# Patient Record
Sex: Female | Born: 1975 | Race: White | Hispanic: No | Marital: Single | Smoking: Never smoker
Health system: Southern US, Community
[De-identification: ages and names within clinical notes are randomized; demographics above are authoritative.]

## PROBLEM LIST (undated history)

## (undated) DIAGNOSIS — I82890 Acute embolism and thrombosis of other specified veins: Secondary | ICD-10-CM

---

## 2013-05-27 ENCOUNTER — Other Ambulatory Visit: Payer: Self-pay | Admitting: Neurology

## 2013-05-27 DIAGNOSIS — R2681 Unsteadiness on feet: Secondary | ICD-10-CM

## 2017-12-07 ENCOUNTER — Emergency Department (HOSPITAL_COMMUNITY)
Admission: EM | Admit: 2017-12-07 | Discharge: 2017-12-08 | Disposition: A | Discharge status: Home / Self Care | Payer: Self-pay | Attending: Emergency Medicine | Admitting: Emergency Medicine

## 2017-12-07 ENCOUNTER — Encounter (HOSPITAL_COMMUNITY): Payer: Self-pay

## 2017-12-07 DIAGNOSIS — K0889 Other specified disorders of teeth and supporting structures: Secondary | ICD-10-CM | POA: Insufficient documentation

## 2017-12-07 DIAGNOSIS — R42 Dizziness and giddiness: Secondary | ICD-10-CM | POA: Insufficient documentation

## 2017-12-07 LAB — CBC
HEMATOCRIT: 39.9 % (ref 36.0–46.0)
HEMOGLOBIN: 13.8 g/dL (ref 12.0–15.0)
MCH: 30.8 pg (ref 26.0–34.0)
MCHC: 34.6 g/dL (ref 30.0–36.0)
MCV: 89.1 fL (ref 78.0–100.0)
Platelets: 162 10*3/uL (ref 150–400)
RBC: 4.48 MIL/uL (ref 3.87–5.11)
RDW: 13.7 % (ref 11.5–15.5)
WBC: 9.3 10*3/uL (ref 4.0–10.5)

## 2017-12-07 LAB — BASIC METABOLIC PANEL
ANION GAP: 11 (ref 5–15)
BUN: 11 mg/dL (ref 6–20)
CALCIUM: 9.3 mg/dL (ref 8.9–10.3)
CO2: 20 mmol/L — ABNORMAL LOW (ref 22–32)
Chloride: 100 mmol/L — ABNORMAL LOW (ref 101–111)
Creatinine, Ser: 0.65 mg/dL (ref 0.44–1.00)
GFR calc Af Amer: >60 mL/min (ref >60)
Glucose, Bld: 104 mg/dL — ABNORMAL HIGH (ref 65–99)
POTASSIUM: 3.5 mmol/L (ref 3.5–5.1)
SODIUM: 131 mmol/L — AB (ref 135–145)

## 2017-12-07 LAB — URINALYSIS, ROUTINE W REFLEX MICROSCOPIC
BILIRUBIN URINE: NEGATIVE
GLUCOSE, UA: NEGATIVE mg/dL
KETONES UR: NEGATIVE mg/dL
LEUKOCYTES UA: NEGATIVE
NITRITE: NEGATIVE
PH: 6 (ref 5.0–8.0)
Protein, ur: NEGATIVE mg/dL
RBC / HPF: NONE SEEN RBC/hpf (ref 0–5)
Specific Gravity, Urine: 1.001 — ABNORMAL LOW (ref 1.005–1.030)

## 2017-12-07 LAB — I-STAT BETA HCG BLOOD, ED (MC, WL, AP ONLY): I-stat hCG, quantitative: <5 m[IU]/mL (ref <5)

## 2017-12-07 NOTE — ED Triage Notes (Signed)
Pt states that she since Tuesday she has been having dental pain on the R upper side, reports not eating in three days and now feels dizzy like she is going to pass out.

## 2017-12-08 LAB — I-STAT VENOUS BLOOD GAS, ED
Acid-base deficit: 3 mmol/L — ABNORMAL HIGH (ref 0.0–2.0)
Bicarbonate: 18.9 mmol/L — ABNORMAL LOW (ref 20.0–28.0)
O2 SAT: 83 %
PH VEN: 7.493 — AB (ref 7.250–7.430)
TCO2: 20 mmol/L — AB (ref 22–32)
pCO2, Ven: 24.7 mmHg — ABNORMAL LOW (ref 44.0–60.0)
pO2, Ven: 42 mmHg (ref 32.0–45.0)

## 2017-12-08 LAB — CBG MONITORING, ED: Glucose-Capillary: 99 mg/dL (ref 65–99)

## 2017-12-08 MED ORDER — MECLIZINE HCL 25 MG PO TABS: 25.0000 mg | ORAL_TABLET | Freq: Three times a day (TID) | ORAL | 0 refills | Status: DC | PRN

## 2017-12-08 MED ORDER — ONDANSETRON 4 MG PO TBDP
4.0000 mg | ORAL_TABLET | Freq: Once | ORAL | Status: AC
  Administered 2017-12-08: 4 mg via ORAL
  Filled 2017-12-08: qty 1

## 2017-12-08 MED ORDER — MIDAZOLAM HCL 2 MG/2ML IJ SOLN
1.0000 mg | Freq: Once | INTRAMUSCULAR | Status: AC
  Administered 2017-12-08: 1 mg via INTRAVENOUS
  Filled 2017-12-08: qty 2

## 2017-12-08 MED ORDER — ONDANSETRON 4 MG PO TBDP: 4.0000 mg | ORAL_TABLET | Freq: Three times a day (TID) | ORAL | 0 refills | Status: AC | PRN

## 2017-12-08 MED ORDER — BUPIVACAINE HCL (PF) 0.5 % IJ SOLN
10.0000 mL | Freq: Once | INTRAMUSCULAR | Status: AC
  Administered 2017-12-08: 10 mL
  Filled 2017-12-08: qty 10

## 2017-12-08 MED ORDER — ACETAMINOPHEN 325 MG PO TABS
650.0000 mg | ORAL_TABLET | Freq: Once | ORAL | Status: AC
  Administered 2017-12-08: 650 mg via ORAL
  Filled 2017-12-08: qty 2

## 2017-12-08 MED ORDER — MECLIZINE HCL 25 MG PO TABS
25.0000 mg | ORAL_TABLET | Freq: Once | ORAL | Status: AC
  Administered 2017-12-08: 25 mg via ORAL
  Filled 2017-12-08: qty 1

## 2017-12-08 NOTE — ED Notes (Signed)
Pt reports inability to sleep, eat or "do anything" due to the pain.  Pt tearful in hall bed.

## 2017-12-08 NOTE — ED Notes (Signed)
Pt verbalized understanding of discharge paperwork and prescriptions. Pt A&O, ambulatory and to call cab for discharge. Reports no more dizziness.

## 2017-12-08 NOTE — ED Provider Notes (Signed)
I assumed care of this patient from Dr. Rhunette CroftNanavati at 0730.  Please see their note for further details of Hx, PE.  Briefly patient is a 42 y.o. female who presented with dental pain and lightheadedness. Pain improved with local anesthetic. Screening labs notable for mild hyponatremia. Provided with antivert for lightheadedness  Current plan is to reassess.  On reassessment, pt sx improved. Able to ambulate w/o complication.   The patient is safe for discharge with strict return precautions.  Disposition: Discharge  Condition: Good  I have discussed the results, Dx and Tx plan with the patient who expressed understanding and agree(s) with the plan. Discharge instructions discussed at great length. The patient was given strict return precautions who verbalized understanding of the instructions. No further questions at time of discharge.    ED Discharge Orders        Ordered    meclizine (ANTIVERT) 25 MG tablet  3 times daily PRN     12/08/17 0911    ondansetron (ZOFRAN ODT) 4 MG disintegrating tablet  Every 8 hours PRN     12/08/17 0911        Nira Connardama, Pedro Eduardo, MD 12/08/17 2000

## 2017-12-08 NOTE — ED Notes (Signed)
Pt ambulatory to bathroom

## 2017-12-27 NOTE — ED Provider Notes (Signed)
[facility] EMERGENCY DEPARTMENT Provider Note   CSN: [id number] Arrival date & time: 12/07/17  2213     History   Chief Complaint Chief Complaint  Patient presents with  . Dental Pain  . Dizziness    HPI Elaine Williams is a 42 y.o. female.  HPI 42 year old female comes in with chief complaint of dental pain.  Patient is also having dizziness and lightheadedness.  Patient states that she has had toothache for the last several days which has affected her p.o. intake.  Patient has pain with any kind of food intake, and she has been drinking as much water she can tolerate.  Patient reports that her pain is on the right upper quadrant of her mouth.  She denies any associated nausea, vomiting, fevers, chills.  Patient has felt like she might faint on couple of occasions.  She is taking pain medications at home with minimal relief.  History reviewed. No pertinent past medical history.  There are no active problems to display for this patient.   Past Surgical History:  Procedure Laterality Date  . CESAREAN SECTION       OB History   None      Home Medications    Prior to Admission medications   Medication Sig Start Date End Date Taking? Authorizing Provider  ibuprofen (ADVIL,MOTRIN) 200 MG tablet Take 200 mg by mouth every 6 (six) hours as needed for fever or mild pain.   Yes [provider]  meclizine (ANTIVERT) 25 MG tablet Take 1 tablet (25 mg total) by mouth 3 (three) times daily as needed for dizziness. 12/08/17   Nira Conn, MD    Family History No family history on file.  Social History Social History   Tobacco Use  . Smoking status: Never Smoker  . Smokeless tobacco: Never Used  Substance Use Topics  . Alcohol use: Never    Frequency: Never  . Drug use: Never     Allergies   Patient has no known allergies.   Review of Systems Review of Systems  Constitutional: Positive for activity change.  HENT: Positive for  dental problem.   Respiratory: Negative for shortness of breath.   Cardiovascular: Negative for chest pain.  Neurological: Positive for dizziness. Negative for syncope.  All other systems reviewed and are negative.    Physical Exam Updated Vital Signs BP 104/68 (BP Location: Right Arm)   Pulse 61   Temp 98.4 F (36.9 C) (Oral)   Resp 17   LMP 11/16/2017   SpO2 99%   Physical Exam  Constitutional: She is oriented to person, place, and time. She appears well-developed.  HENT:  Head: Normocephalic and atraumatic.  Cracked tooth in the right upper quadrant  Eyes: Pupils are equal, round, and reactive to light. EOM are normal.  Neck: Normal range of motion. Neck supple.  Cardiovascular: Normal rate.  Pulmonary/Chest: Effort normal and breath sounds normal.  Abdominal: Bowel sounds are normal.  Musculoskeletal: She exhibits no edema or tenderness.  Neurological: She is alert and oriented to person, place, and time.  Skin: Skin is warm and dry.  Nursing note and vitals reviewed.    ED Treatments / Results  Labs (all labs ordered are listed, but only abnormal results are displayed) Labs Reviewed  BASIC METABOLIC PANEL - Abnormal; Notable for the following components:      Result Value   Sodium 131 (*)    Chloride 100 (*)    CO2 20 (*)  Glucose, Bld 104 (*)    All other components within normal limits  URINALYSIS, ROUTINE W REFLEX MICROSCOPIC - Abnormal; Notable for the following components:   Color, Urine COLORLESS (*)    Specific Gravity, Urine 1.001 (*)    Hgb urine dipstick SMALL (*)    Bacteria, UA RARE (*)    Squamous Epithelial / LPF 0-5 (*)    All other components within normal limits  I-STAT VENOUS BLOOD GAS, ED - Abnormal; Notable for the following components:   pH, Ven 7.493 (*)    pCO2, Ven 24.7 (*)    Bicarbonate 18.9 (*)    TCO2 20 (*)    Acid-base deficit 3.0 (*)    All other components within normal limits  CBC  I-STAT BETA HCG BLOOD, ED ([facility], [facility],  [facility] ONLY)  CBG MONITORING, ED    EKG EKG Interpretation  Date/Time:  Sunday December 08 2017 06:11:28 EDT Ventricular Rate:  61 PR Interval:    QRS Duration: 92 QT Interval:  425 QTC Calculation: 429 R Axis:   84 Text Interpretation:  Sinus rhythm No acute changes Nonspecific ST and T wave abnormality No old tracing to compare Confirmed by Derwood Kaplan [phone]) on 12/08/2017 6:44:01 AM   Radiology No results found.  Procedures .Nerve Block Date/Time: 12/08/2017 4:23 PM Performed by: Derwood Kaplan, MD Authorized by: Derwood Kaplan, MD   Consent:    Consent obtained:  Verbal   Consent given by:  Patient   Risks discussed:  Infection, allergic reaction, nerve damage, swelling, unsuccessful block, pain, intravenous injection and bleeding   Alternatives discussed:  No treatment Indications:    Indications:  Pain relief Location:    Laterality:  Right Pre-procedure details:    Skin preparation:  Alcohol   Preparation: Patient was prepped and draped in usual sterile fashion   Skin anesthesia (see MAR for exact dosages):    Skin anesthesia method:  None Procedure details (see MAR for exact dosages):    Block needle gauge:  22 G   Anesthetic injected:  Bupivacaine 0.5% WITH epi   Steroid injected:  None   Additive injected:  None   Injection procedure:  Anatomic landmarks identified Post-procedure details:    Dressing:  None   Outcome:  Anesthesia achieved   Patient tolerance of procedure:  Tolerated well, no immediate complications   (including critical care time)  Medications Ordered in ED Medications  bupivacaine (MARCAINE) 0.5 % injection 10 mL (10 mLs Infiltration Given by Other 12/08/17 0509)  acetaminophen (TYLENOL) tablet 650 mg (650 mg Oral Given 12/08/17 0509)  ondansetron (ZOFRAN-ODT) disintegrating tablet 4 mg (4 mg Oral Given 12/08/17 0643)  midazolam (VERSED) injection 1 mg (1 mg Intravenous Given 12/08/17 0755)  meclizine (ANTIVERT) tablet 25 mg (25 mg Oral  Given 12/08/17 0830)     Initial Impression / Assessment and Plan / ED Course  I have reviewed the triage vital signs and the nursing notes.  Pertinent labs & imaging results that were available during my care of the patient were reviewed by me and considered in my medical decision making (see chart for details).     Patient comes in with chief complaint of dental pain and dizziness.  Her dental pain has led to reduced p.o. intake and patient now has had episodes of dizziness with near syncope.  Patient does not have any cardiac disease history, and her labs overall are reassuring.  I suspect that her dizziness, near syncope is due to combination of mild dehydration  and severe pain that is anxiety provoking to her.  Patient is agreed to dental nerve block which was completed in the ED.  Patient did get some relief from that.  We do want to make sure that patient is tolerating p.o. before discharging her, therefore we are transferring care to Dr. Eudelia Bunch who will follow. Labs have been checked.  There is mild acidosis and mild hyponatremia.   Final Clinical Impressions(s) / ED Diagnoses   Final diagnoses:  Pain, dental  Dizziness    ED Discharge Orders        Ordered    meclizine (ANTIVERT) 25 MG tablet  3 times daily PRN     12/08/17 0911    ondansetron (ZOFRAN ODT) 4 MG disintegrating tablet  Every 8 hours PRN     12/08/17 0911       Derwood Kaplan, MD 12/27/17 1626

## 2020-11-26 ENCOUNTER — Emergency Department (HOSPITAL_COMMUNITY): Payer: Medicaid Other

## 2020-11-26 ENCOUNTER — Other Ambulatory Visit: Payer: Self-pay

## 2020-11-26 ENCOUNTER — Emergency Department (HOSPITAL_COMMUNITY)
Admission: EM | Admit: 2020-11-26 | Discharge: 2020-11-26 | Disposition: A | Discharge status: Home / Self Care | Payer: Medicaid Other | Attending: Emergency Medicine | Admitting: Emergency Medicine

## 2020-11-26 DIAGNOSIS — R1030 Lower abdominal pain, unspecified: Secondary | ICD-10-CM | POA: Diagnosis present

## 2020-11-26 DIAGNOSIS — K5732 Diverticulitis of large intestine without perforation or abscess without bleeding: Secondary | ICD-10-CM | POA: Insufficient documentation

## 2020-11-26 DIAGNOSIS — K5792 Diverticulitis of intestine, part unspecified, without perforation or abscess without bleeding: Secondary | ICD-10-CM

## 2020-11-26 LAB — COMPREHENSIVE METABOLIC PANEL
ALT: 12 U/L (ref 0–44)
AST: 13 U/L — ABNORMAL LOW (ref 15–41)
Albumin: 3.8 g/dL (ref 3.5–5.0)
Alkaline Phosphatase: 76 U/L (ref 38–126)
Anion gap: 8 (ref 5–15)
BUN: 12 mg/dL (ref 6–20)
CO2: 23 mmol/L (ref 22–32)
Calcium: 9.4 mg/dL (ref 8.9–10.3)
Chloride: 105 mmol/L (ref 98–111)
Creatinine, Ser: 0.7 mg/dL (ref 0.44–1.00)
GFR, Estimated: >60 mL/min (ref >60)
Glucose, Bld: 111 mg/dL — ABNORMAL HIGH (ref 70–99)
Potassium: 3.4 mmol/L — ABNORMAL LOW (ref 3.5–5.1)
Sodium: 136 mmol/L (ref 135–145)
Total Bilirubin: 0.6 mg/dL (ref 0.3–1.2)
Total Protein: 7.3 g/dL (ref 6.5–8.1)

## 2020-11-26 LAB — URINALYSIS, ROUTINE W REFLEX MICROSCOPIC
Glucose, UA: NEGATIVE mg/dL
Ketones, ur: 15 mg/dL — AB
Leukocytes,Ua: NEGATIVE
Nitrite: NEGATIVE
Protein, ur: 30 mg/dL — AB
Specific Gravity, Urine: 1.025 (ref 1.005–1.030)
pH: 6 (ref 5.0–8.0)

## 2020-11-26 LAB — CBC
HCT: 42.6 % (ref 36.0–46.0)
Hemoglobin: 14.5 g/dL (ref 12.0–15.0)
MCH: 30.9 pg (ref 26.0–34.0)
MCHC: 34 g/dL (ref 30.0–36.0)
MCV: 90.8 fL (ref 80.0–100.0)
Platelets: 196 10*3/uL (ref 150–400)
RBC: 4.69 MIL/uL (ref 3.87–5.11)
RDW: 12.9 % (ref 11.5–15.5)
WBC: 12 10*3/uL — ABNORMAL HIGH (ref 4.0–10.5)
nRBC: 0 % (ref 0.0–0.2)

## 2020-11-26 LAB — I-STAT BETA HCG BLOOD, ED (MC, WL, AP ONLY): I-stat hCG, quantitative: <5 m[IU]/mL (ref <5)

## 2020-11-26 LAB — URINALYSIS, MICROSCOPIC (REFLEX): WBC, UA: NONE SEEN WBC/hpf (ref 0–5)

## 2020-11-26 LAB — LIPASE, BLOOD: Lipase: 24 U/L (ref 11–51)

## 2020-11-26 MED ORDER — ONDANSETRON 8 MG PO TBDP: 8.0000 mg | ORAL_TABLET | Freq: Three times a day (TID) | ORAL | 0 refills | Status: DC | PRN

## 2020-11-26 MED ORDER — ONDANSETRON HCL 4 MG/2ML IJ SOLN
4.0000 mg | Freq: Once | INTRAMUSCULAR | Status: AC
  Administered 2020-11-26: 4 mg via INTRAVENOUS
  Filled 2020-11-26: qty 2

## 2020-11-26 MED ORDER — CIPROFLOXACIN HCL 500 MG PO TABS: 500.0000 mg | ORAL_TABLET | Freq: Two times a day (BID) | ORAL | 0 refills | Status: DC

## 2020-11-26 MED ORDER — SODIUM CHLORIDE 0.9 % IV BOLUS
1000.0000 mL | Freq: Once | INTRAVENOUS | Status: AC
  Administered 2020-11-26: 1000 mL via INTRAVENOUS

## 2020-11-26 MED ORDER — METRONIDAZOLE 500 MG PO TABS: 500.0000 mg | ORAL_TABLET | Freq: Two times a day (BID) | ORAL | 0 refills | Status: DC

## 2020-11-26 MED ORDER — CIPROFLOXACIN IN D5W 400 MG/200ML IV SOLN
400.0000 mg | Freq: Once | INTRAVENOUS | Status: DC
  Filled 2020-11-26: qty 200

## 2020-11-26 MED ORDER — METRONIDAZOLE 500 MG PO TABS
500.0000 mg | ORAL_TABLET | Freq: Once | ORAL | Status: AC
  Administered 2020-11-26: 500 mg via ORAL
  Filled 2020-11-26: qty 1

## 2020-11-26 MED ORDER — FENTANYL CITRATE (PF) 100 MCG/2ML IJ SOLN
25.0000 ug | Freq: Once | INTRAMUSCULAR | Status: AC
  Administered 2020-11-26: 25 ug via INTRAVENOUS
  Filled 2020-11-26: qty 2

## 2020-11-26 MED ORDER — IOHEXOL 300 MG/ML  SOLN
100.0000 mL | Freq: Once | INTRAMUSCULAR | Status: AC | PRN
  Administered 2020-11-26: 100 mL via INTRAVENOUS

## 2020-11-26 NOTE — ED Notes (Signed)
Pt's IV was removed by nurse tech once the doctor had put this patient up for discharge. Dr. Rosalia Hammers informed and states ok for patient to leave and get her prescriptions filled to take her ABX as prescribed. She states for the patient to take her prescriptions as prescribed. I went over all of this with the patient and she verbalized understanding of d/c instructions, prescribed medications and follow up care.

## 2020-11-26 NOTE — ED Provider Notes (Signed)
[facility] EMERGENCY DEPARTMENT Provider Note   CSN: [id number] Arrival date & time: 11/26/20  7106     History No chief complaint on file.   Elaine Williams is a 45 y.o. female.  HPI 45 year old female presents today complaining of crampy lower abdominal pain for the past week with associated nausea, vomiting, and diarrhea.  She has had multiple episodes of vomiting and stooling without any blood in her emesis.  She has had some chills but no fever.  Last night she had several episodes of urinating that revealed blood.  She states she had pain difficulty starting her stream with the episodes.  She reports a normal amount of urine.  She denies any past history of urinary tract infection.  She denies sexual activity over the past year.  He has had crampy lower abdominal pain with any food intake over the past week.  She has not had anything down to eat over the past 36 hours.  She has been sipping on water.  She has not recently vomited.  She has previously had a C-section but no other abdominal surgery.  She denies any significant past medical history and is not taking any medications.  Last menstrual cycle was last month and was normal and a normal time.  She is not currently using any birth control.     No past medical history on file.  There are no problems to display for this patient.   Past Surgical History:  Procedure Laterality Date  . CESAREAN SECTION       OB History   No obstetric history on file.     No family history on file.  Social History   Tobacco Use  . Smoking status: Never Smoker  . Smokeless tobacco: Never Used  Substance Use Topics  . Alcohol use: Never  . Drug use: Never    Home Medications Prior to Admission medications   Medication Sig Start Date End Date Taking? Authorizing Provider  ibuprofen (ADVIL,MOTRIN) 200 MG tablet Take 200 mg by mouth every 6 (six) hours as needed for fever or mild pain.    [provider]   meclizine (ANTIVERT) 25 MG tablet Take 1 tablet (25 mg total) by mouth 3 (three) times daily as needed for dizziness. 12/08/17   Nira Conn, MD    Allergies    Patient has no known allergies.  Review of Systems   Review of Systems  All other systems reviewed and are negative.   Physical Exam Updated Vital Signs BP 127/84   Pulse (!) 102   Temp 98.3 F (36.8 C) (Oral)   Resp 16   Ht 1.702 m (5\' 7" )   Wt 81.6 kg   LMP 11/09/2020   SpO2 99%   BMI 28.19 kg/m   Physical Exam Vitals and nursing note reviewed.  Constitutional:      Appearance: Normal appearance.  HENT:     Head: Normocephalic.     Left Ear: External ear normal.     Nose: Nose normal.     Mouth/Throat:     Pharynx: Oropharynx is clear.  Eyes:     Extraocular Movements: Extraocular movements intact.     Pupils: Pupils are equal, round, and reactive to light.  Cardiovascular:     Rate and Rhythm: Normal rate and regular rhythm.     Pulses: Normal pulses.  Pulmonary:     Effort: Pulmonary effort is normal.     Breath sounds: Normal breath sounds.  Abdominal:  General: Abdomen is flat. There is no distension.     Tenderness: There is abdominal tenderness.     Comments: Tenderness bilateral lower quadrants greater in the right lower quadrant  Musculoskeletal:        General: Normal range of motion.     Cervical back: Normal range of motion and neck supple.  Skin:    General: Skin is warm and dry.     Capillary Refill: Capillary refill takes less than 2 seconds.  Neurological:     General: No focal deficit present.     Mental Status: She is alert.  Psychiatric:        Mood and Affect: Mood normal.        Behavior: Behavior normal.     ED Results / Procedures / Treatments   Labs (all labs ordered are listed, but only abnormal results are displayed) Labs Reviewed  CBC - Abnormal; Notable for the following components:      Result Value   WBC 12.0 (*)    All other components within  normal limits  COMPREHENSIVE METABOLIC PANEL - Abnormal; Notable for the following components:   Potassium 3.4 (*)    Glucose, Bld 111 (*)    AST 13 (*)    All other components within normal limits  URINALYSIS, ROUTINE W REFLEX MICROSCOPIC - Abnormal; Notable for the following components:   Hgb urine dipstick MODERATE (*)    Bilirubin Urine SMALL (*)    Ketones, ur 15 (*)    Protein, ur 30 (*)    All other components within normal limits  URINALYSIS, MICROSCOPIC (REFLEX) - Abnormal; Notable for the following components:   Bacteria, UA RARE (*)    All other components within normal limits  LIPASE, BLOOD  I-STAT BETA HCG BLOOD, ED ([facility], [facility], [facility] ONLY)    EKG None  Radiology CT ABDOMEN PELVIS W CONTRAST  Result Date: 11/26/2020 CLINICAL DATA:  45 year old female with history of acute nonlocalized abdominal pain for the past 2 weeks. Vomiting for the past 2 days. EXAM: CT ABDOMEN AND PELVIS WITH CONTRAST TECHNIQUE: Multidetector CT imaging of the abdomen and pelvis was performed using the standard protocol following bolus administration of intravenous contrast. CONTRAST:  OMNIPAQUE IOHEXOL 300 MG/ML  SOLN COMPARISON:  No priors. FINDINGS: Lower chest: Unremarkable. Hepatobiliary: No suspicious cystic or solid hepatic lesions. No intra or extrahepatic biliary ductal dilatation. Gallbladder is normal in appearance. Pancreas: No pancreatic mass. No pancreatic ductal dilatation. No pancreatic or peripancreatic fluid collections or inflammatory changes. Spleen: Unremarkable. Adrenals/Urinary Tract: Bilateral kidneys and bilateral adrenal glands are normal in appearance. No hydroureteronephrosis. Gas is noted non dependently in the lumen of the urinary bladder. Urinary bladder is otherwise normal in appearance. Stomach/Bowel: Normal appearance of the stomach. No pathologic dilatation of small bowel or colon. Numerous colonic diverticulae are noted, particularly in the sigmoid colon where there are  some subtle inflammatory changes noted adjacent to the proximal sigmoid colon, which could indicate very early or mild acute diverticulitis. No diverticular abscess or signs of frank perforation are noted at this time. Normal appendix. Vascular/Lymphatic: No significant atherosclerotic noted in the abdominal or pelvic vasculature disease, aneurysm. No lymphadenopathy noted in the abdomen or pelvis. Reproductive: Uterus is normal in appearance. Left ovary is unremarkable in appearance. Degenerating corpus luteum cyst in the right ovary incidentally noted. Other: Trace volume of free fluid in the cul-de-sac, presumably physiologic in this young female patient. No larger volume of ascites. No pneumoperitoneum. Musculoskeletal: There are no  aggressive appearing lytic or blastic lesions noted in the visualized portions of the skeleton. IMPRESSION: 1. Gas is present non dependently in the lumen of the urinary bladder. This could be iatrogenic if there has been recent catheterization for urinalysis. In the absence of a history of catheterization, correlation with urinalysis would be recommended to exclude the possibility of urinary tract infection with gas-forming organisms. Otherwise, there are no findings to account for the patient's hematuria. 2. Subtle inflammatory changes adjacent to the sigmoid colon where there are numerous colonic diverticulae are. Findings are concerning for early or mild acute diverticulitis. No diverticular abscess or signs of frank perforation are noted at this time. 3. Degenerating corpus luteum cyst in the right ovary with small volume of physiologic free fluid in the pelvis. Electronically Signed   By: Trudie Reed M.D.   On: 11/26/2020 11:16    Procedures Procedures   Medications Ordered in ED Medications  sodium chloride 0.9 % bolus 1,000 mL (has no administration in time range)  ondansetron (ZOFRAN) injection 4 mg (has no administration in time range)  fentaNYL (SUBLIMAZE)  injection 25 mcg (has no administration in time range)    ED Course  I have reviewed the triage vital signs and the nursing notes.  Pertinent labs & imaging results that were available during my care of the patient were reviewed by me and considered in my medical decision making (see chart for details). Differential diagnosis Acute abdominal process such as pancreatitis, cholecystitis, appendicitis, diverticulitis, or other intra-abdominal processes Pelvic processes such as pregnancy, pelvic infection, or other acute pelvic processes Infectious etiology including colitis, gastroenteritis Patient with urinary tract bleeding per report could be secondary to stone disease, trauma, or infection CT obtained appears most consistent with diverticulitis.  There is some air noted in the urinary tract however, urine does not appear acutely infected and patient did have In-N-Out cath here was likely explaining the air.  No other etiology of her hematuria is noted.  Given concern for diverticulitis, patient started on Cipro and Flagyl here.  She will continue this outpatient.  We discussed that this would also cover your urinary pathogens. We have discussed return precautions and need for follow-up and she voices understanding. MDM Rules/Calculators/A&P                           Final Clinical Impression(s) / ED Diagnoses Final diagnoses:  Diverticulitis    Rx / DC Orders ED Discharge Orders         Ordered    ciprofloxacin (CIPRO) 500 MG tablet  Every 12 hours        11/26/20 1200    metroNIDAZOLE (FLAGYL) 500 MG tablet  2 times daily        11/26/20 1200           Margarita Grizzle, MD 11/26/20 1201

## 2020-11-26 NOTE — ED Triage Notes (Signed)
Patient arrives with c/o abd pain x2 weeks, vomiting x2 days, and heavy blood in urine with painful urination (no blood noted today). Patient reports nausea, headache, and unable to keep food/liquids down. Denies pregnancy.

## 2020-11-26 NOTE — Discharge Instructions (Addendum)
Please take medications as prescribed Please drink plenty of clear liquids Return to the emergency department if you have any worsening symptoms including increased bleeding or worsening pain. Please obtain primary care doctor in follow-up as an outpatient in the next 1 to 2 weeks

## 2021-11-09 IMAGING — CT CT ABD-PELV W/ CM
2 of 5 series · 15 of 46 positions shown, 17 images · IV contrast (omnipaque)
Comparison: No priors.

CLINICAL DATA: 44-year-old female with history of acute
nonlocalized abdominal pain for the past 2 weeks. Vomiting for the
past 2 days.

EXAM:
CT ABDOMEN AND PELVIS WITH CONTRAST
TECHNIQUE: Multidetector CT imaging of the abdomen and pelvis was performed
using the standard protocol following bolus administration of
intravenous contrast.
CONTRAST:  100mL OMNIPAQUE IOHEXOL 300 MG/ML  SOLN

[Series 4: abdomen 5.0 · axial · 0.78mm/px · z∈[+610,+1000]mm · 12 of 92 slices shown, 14 images]
[im 7/92  soft-tissue]
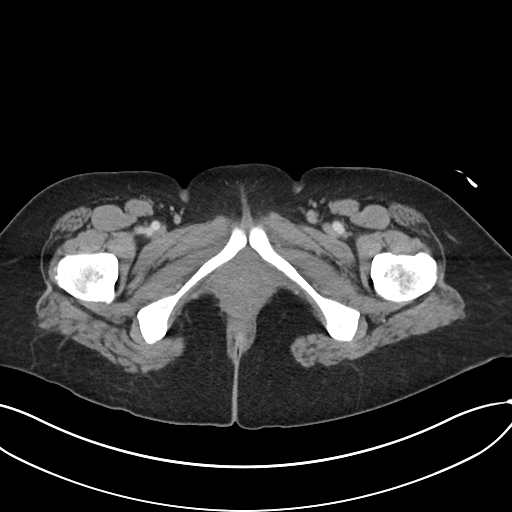
[im 7/92  bone]
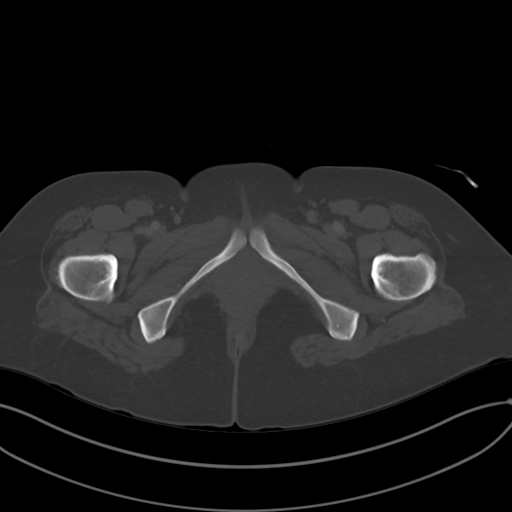
[im 13/92  soft-tissue]
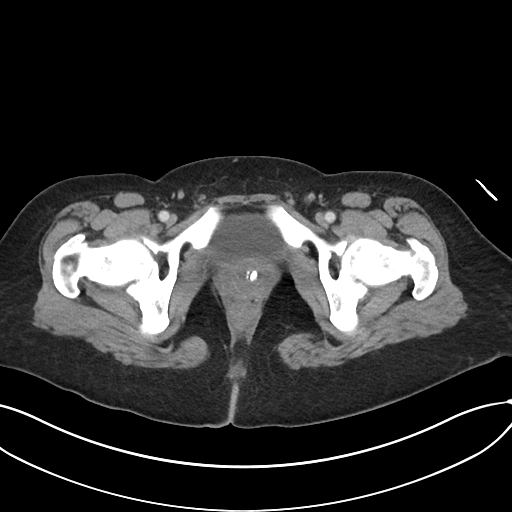
[im 19/92  soft-tissue]
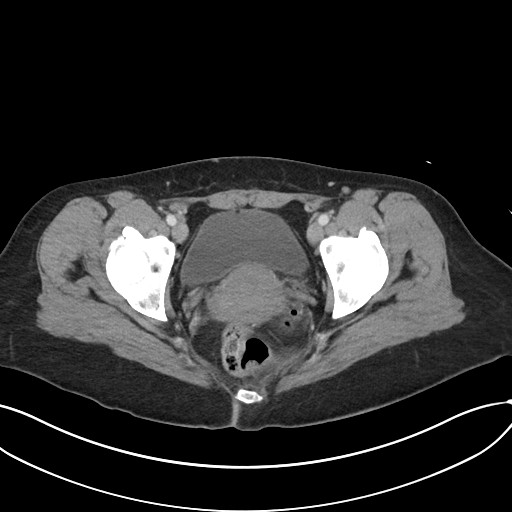
[im 31/92  soft-tissue]
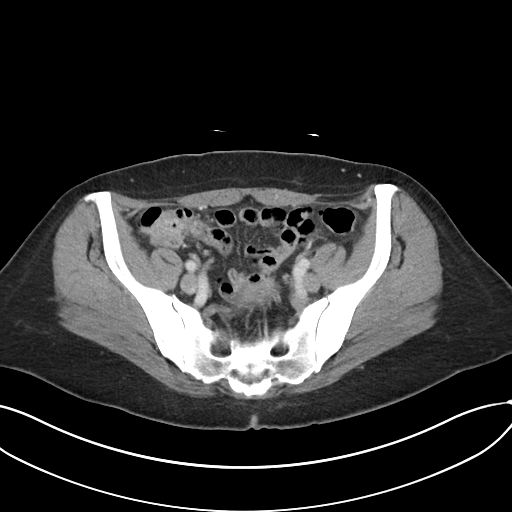
[im 37/92  soft-tissue]
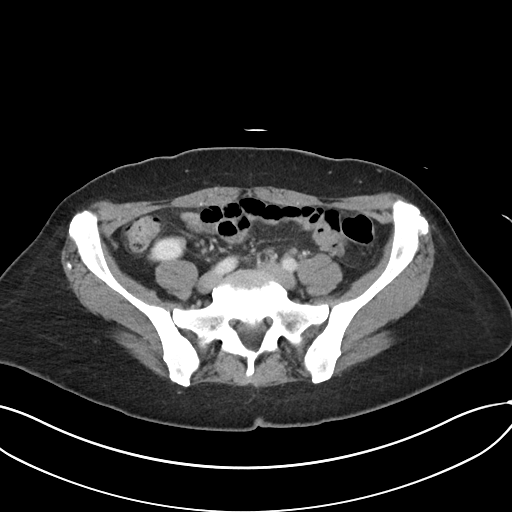
[im 43/92  soft-tissue]
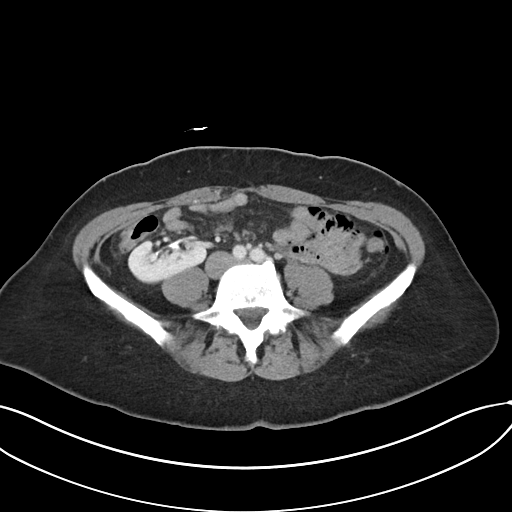
[im 49/92  soft-tissue]
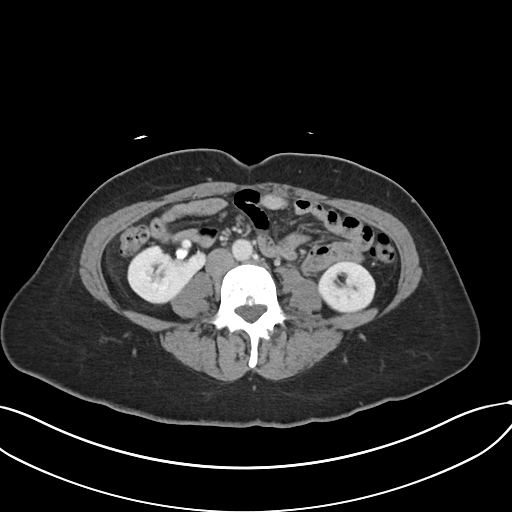
[im 55/92  soft-tissue]
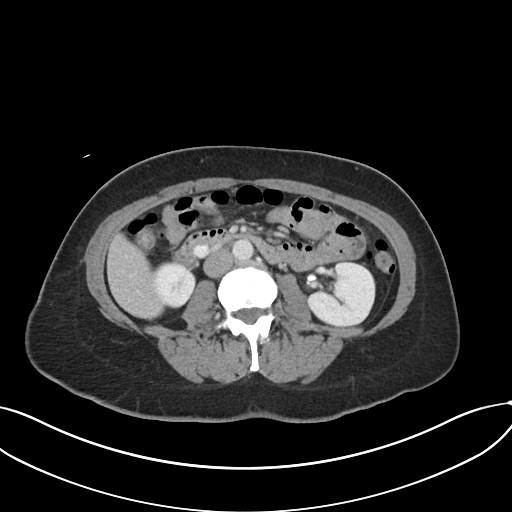
[im 61/92  soft-tissue]
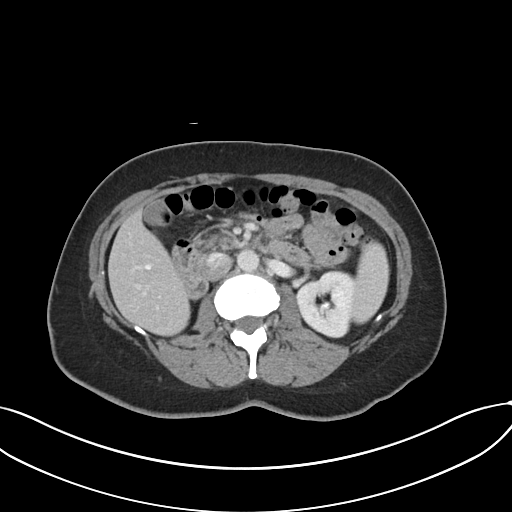
[im 61/92  bone]
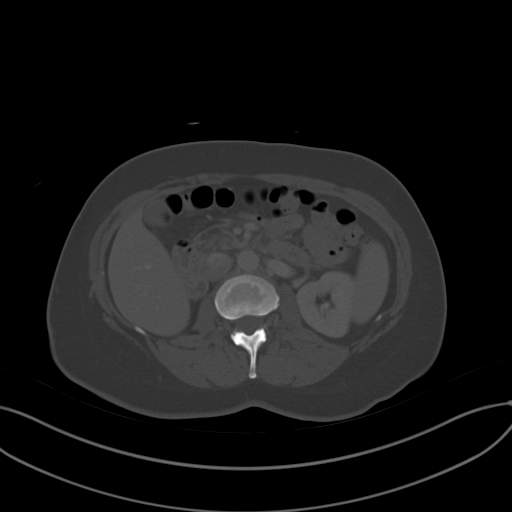
[im 73/92  soft-tissue]
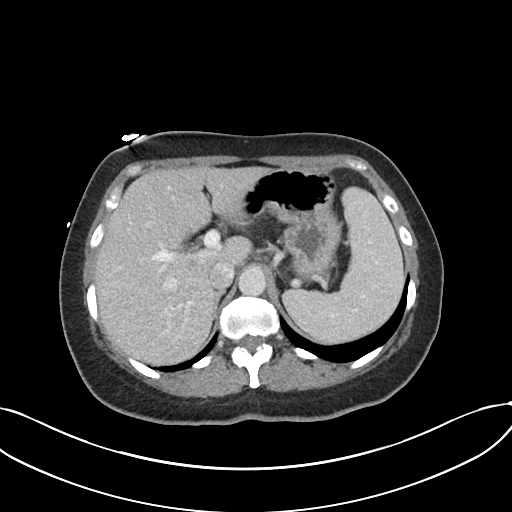
[im 79/92  soft-tissue]
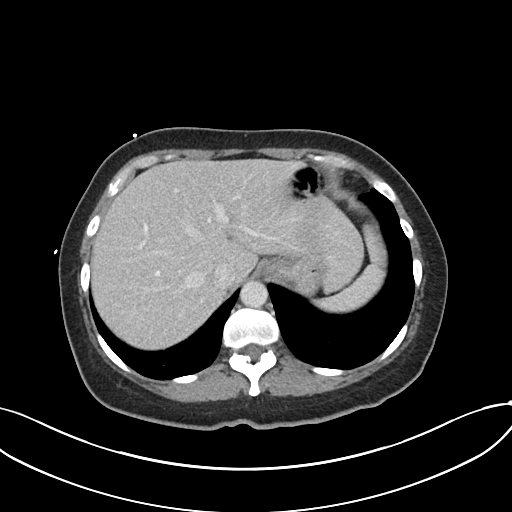
[im 85/92  soft-tissue]
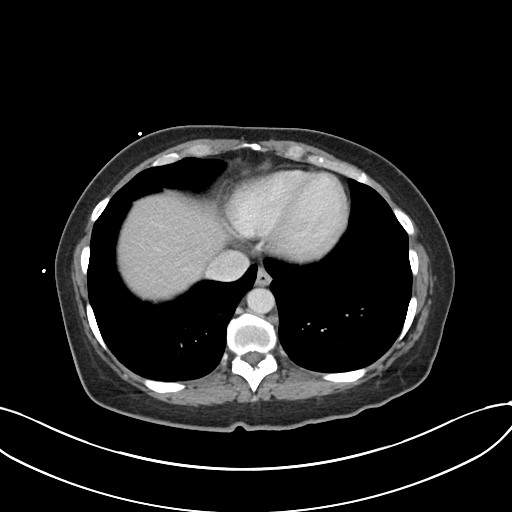

[Series 7: abdomen 3.0 mpr cor · coronal · 0.78mm/px · 3 of 82 slices shown]
[im 28/82  soft-tissue]
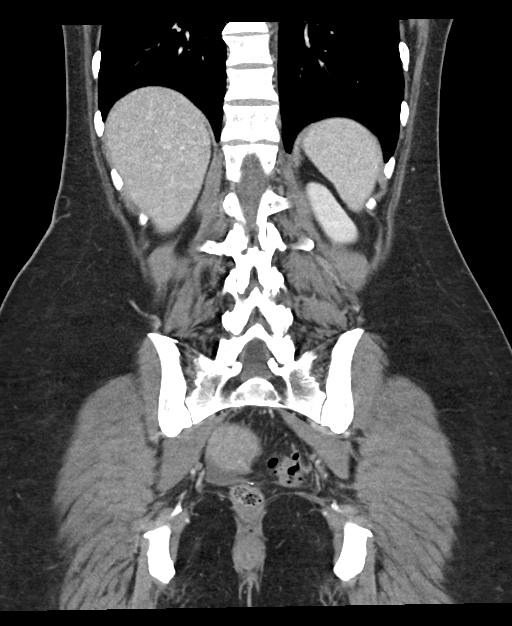
[im 37/82  soft-tissue]
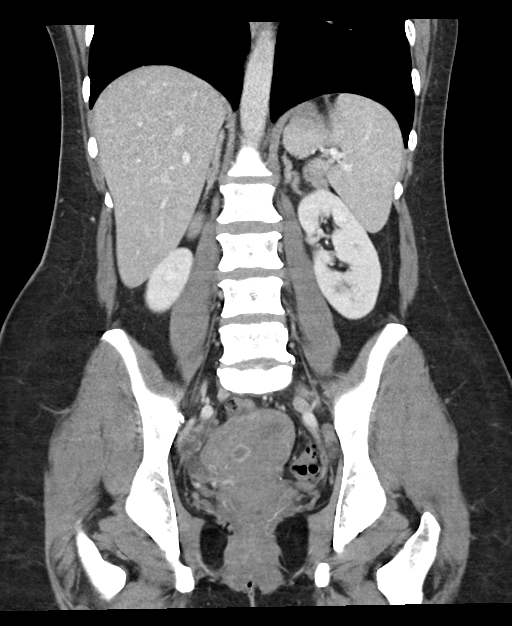
[im 46/82  soft-tissue]
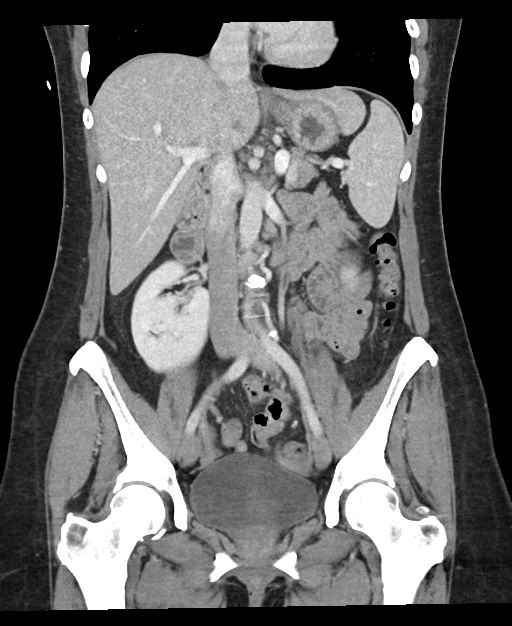

[15 of 46 positions shown; findings below may reference images not displayed]

FINDINGS: Lower chest: Unremarkable.

Hepatobiliary: No suspicious cystic or solid hepatic lesions. No
intra or extrahepatic biliary ductal dilatation. Gallbladder is
normal in appearance.

Pancreas: No pancreatic mass. No pancreatic ductal dilatation. No
pancreatic or peripancreatic fluid collections or inflammatory
changes.

Spleen: Unremarkable.

Adrenals/Urinary Tract: Bilateral kidneys and bilateral adrenal
glands are normal in appearance. No hydroureteronephrosis. Gas is
noted non dependently in the lumen of the urinary bladder. Urinary
bladder is otherwise normal in appearance.

Stomach/Bowel: Normal appearance of the stomach. No pathologic
dilatation of small bowel or colon. Numerous colonic diverticulae
are noted, particularly in the sigmoid colon where there are some
subtle inflammatory changes noted adjacent to the proximal sigmoid
colon, which could indicate very early or mild acute diverticulitis.
No diverticular abscess or signs of frank perforation are noted at
this time. Normal appendix.

Vascular/Lymphatic: No significant atherosclerotic noted in the
abdominal or pelvic vasculature disease, aneurysm. No
lymphadenopathy noted in the abdomen or pelvis.

Reproductive: Uterus is normal in appearance. Left ovary is
unremarkable in appearance. Degenerating corpus luteum cyst in the
right ovary incidentally noted.

Other: Trace volume of free fluid in the cul-de-sac, presumably
physiologic in this young female patient. No larger volume of
ascites. No pneumoperitoneum.

Musculoskeletal: There are no aggressive appearing lytic or blastic
lesions noted in the visualized portions of the skeleton.
IMPRESSION: 1. Gas is present non dependently in the lumen of the urinary
bladder. This could be iatrogenic if there has been recent
catheterization for urinalysis. In the absence of a history of
catheterization, correlation with urinalysis would be recommended to
exclude the possibility of urinary tract infection with gas-forming
organisms. Otherwise, there are no findings to account for the
patient's hematuria.
2. Subtle inflammatory changes adjacent to the sigmoid colon where
there are numerous colonic diverticulae are. Findings are concerning
for early or mild acute diverticulitis. No diverticular abscess or
signs of frank perforation are noted at this time.
3. Degenerating corpus luteum cyst in the right ovary with small
volume of physiologic free fluid in the pelvis.

## 2023-01-21 ENCOUNTER — Ambulatory Visit
Admission: EM | Admit: 2023-01-21 | Discharge: 2023-01-21 | Disposition: A | Discharge status: Home / Self Care | Payer: Managed Care, Other (non HMO) | Attending: Urgent Care | Admitting: Urgent Care

## 2023-01-21 DIAGNOSIS — J0100 Acute maxillary sinusitis, unspecified: Secondary | ICD-10-CM

## 2023-01-21 MED ORDER — PSEUDOEPHEDRINE HCL 60 MG PO TABS: 60.0000 mg | ORAL_TABLET | Freq: Three times a day (TID) | ORAL | 0 refills | Status: DC | PRN

## 2023-01-21 MED ORDER — CETIRIZINE HCL 10 MG PO TABS: 10.0000 mg | ORAL_TABLET | Freq: Every day | ORAL | 0 refills | Status: DC

## 2023-01-21 MED ORDER — AMOXICILLIN 875 MG PO TABS: 875.0000 mg | ORAL_TABLET | Freq: Two times a day (BID) | ORAL | 0 refills | Status: DC

## 2023-01-21 NOTE — ED Triage Notes (Signed)
Pt reports nasal congestion, nausea, headache x 2 weeks. States "smells rotten" when blows the nose. OTC cold meds gives no relief.

## 2023-01-21 NOTE — Discharge Instructions (Addendum)
We will manage this as a sinus infection with amoxicillin. For sore throat or cough try using a honey-based tea. Use 3 teaspoons of honey with juice squeezed from half lemon. Place shaved pieces of ginger into 1/2-1 cup of water and warm over stove top. Then mix the ingredients and repeat every 4 hours as needed. Please take ibuprofen 600mg every 6 hours with food alternating with OR taken together with Tylenol 650mg every 6 hours for throat pain, fevers, aches and pains. Hydrate very well with at least 2 liters of water. Eat light meals such as soups (chicken and noodles, vegetable, chicken and wild rice).  Do not eat foods that you are allergic to.  Taking an antihistamine like Zyrtec can help against postnasal drainage, sinus congestion which can cause sinus pain, sinus headaches, throat pain, painful swallowing, coughing.  You can take this together with pseudoephedrine (Sudafed) at a dose of 60 mg 3 times a day or twice daily as needed for the same kind of nasal drip, congestion.    

## 2023-01-21 NOTE — ED Provider Notes (Signed)
[facility]  Note:  This document was prepared using Conservation officer, historic buildings and may include unintentional dictation errors.  MRN: [id number] DOB: [birth year]  Subjective:   Elaine Williams is a 47 y.o. female presenting for 2-week history of persistent and worsening sinus congestion, sinus headaches, nausea.  Started coughing in the past few days and the mucus that is coming out of her sinuses is much darker colored with some malodor.  No chest pain, shortness of breath or wheezing.  No ear pain, throat pain.  Has been using multiple over-the-counter measures without any relief.  No current facility-administered medications for this encounter.  Current Outpatient Medications:    ciprofloxacin (CIPRO) 500 MG tablet, Take 1 tablet (500 mg total) by mouth every 12 (twelve) hours., Disp: 14 tablet, Rfl: 0   ibuprofen (ADVIL,MOTRIN) 200 MG tablet, Take 200 mg by mouth every 6 (six) hours as needed for fever or mild pain., Disp: , Rfl:    meclizine (ANTIVERT) 25 MG tablet, Take 1 tablet (25 mg total) by mouth 3 (three) times daily as needed for dizziness. (Patient not taking: Reported on 11/26/2020), Disp: 30 tablet, Rfl: 0   metroNIDAZOLE (FLAGYL) 500 MG tablet, Take 1 tablet (500 mg total) by mouth 2 (two) times daily., Disp: 14 tablet, Rfl: 0   ondansetron (ZOFRAN ODT) 8 MG disintegrating tablet, Take 1 tablet (8 mg total) by mouth every 8 (eight) hours as needed for nausea or vomiting., Disp: 20 tablet, Rfl: 0   No Known Allergies  History reviewed. No pertinent past medical history.   Past Surgical History:  Procedure Laterality Date   CESAREAN SECTION      History reviewed. No pertinent family history.  Social History   Tobacco Use   Smoking status: Never   Smokeless tobacco: Never  Substance Use Topics   Alcohol use: Never   Drug use: Never    ROS   Objective:   Vitals: BP (!) 146/93 (BP Location: Right Arm)   Pulse 73   Temp 98.4 F  (36.9 C) (Oral)   Resp 18   LMP  (Within Weeks) Comment: 2 weeks  SpO2 97%   Physical Exam Constitutional:      General: She is not in acute distress.    Appearance: Normal appearance. She is well-developed and normal weight. She is not ill-appearing, toxic-appearing or diaphoretic.  HENT:     Head: Normocephalic and atraumatic.     Right Ear: Tympanic membrane, ear canal and external ear normal. No drainage or tenderness. No middle ear effusion. There is no impacted cerumen. Tympanic membrane is not erythematous or bulging.     Left Ear: Tympanic membrane, ear canal and external ear normal. No drainage or tenderness.  No middle ear effusion. There is no impacted cerumen. Tympanic membrane is not erythematous or bulging.     Nose: Congestion and rhinorrhea present.     Right Sinus: No maxillary sinus tenderness or frontal sinus tenderness.     Left Sinus: Maxillary sinus tenderness present. No frontal sinus tenderness.     Mouth/Throat:     Mouth: Mucous membranes are moist. No oral lesions.     Pharynx: No pharyngeal swelling, oropharyngeal exudate, posterior oropharyngeal erythema or uvula swelling.     Tonsils: No tonsillar exudate or tonsillar abscesses.  Eyes:     General: No scleral icterus.       Right eye: No discharge.        Left eye: No discharge.  Extraocular Movements: Extraocular movements intact.     Right eye: Normal extraocular motion.     Left eye: Normal extraocular motion.     Conjunctiva/sclera: Conjunctivae normal.  Cardiovascular:     Rate and Rhythm: Normal rate.  Pulmonary:     Effort: Pulmonary effort is normal.  Musculoskeletal:     Cervical back: Normal range of motion and neck supple.  Lymphadenopathy:     Cervical: No cervical adenopathy.  Skin:    General: Skin is warm and dry.  Neurological:     General: No focal deficit present.     Mental Status: She is alert and oriented to person, place, and time.  Psychiatric:        Mood and Affect:  Mood normal.        Behavior: Behavior normal.     Assessment and Plan :   PDMP not reviewed this encounter.  1. Acute maxillary sinusitis, recurrence not specified    Will start empiric treatment for sinusitis with amoxicillin.  Recommended supportive care otherwise. Counseled patient on potential for adverse effects with medications prescribed/recommended today, ER and return-to-clinic precautions discussed, patient verbalized understanding.    Wallis Bamberg, [place] 01/21/23 [id number]

## 2023-04-15 ENCOUNTER — Ambulatory Visit
Admission: EM | Admit: 2023-04-15 | Discharge: 2023-04-15 | Disposition: A | Discharge status: Home / Self Care | Payer: Managed Care, Other (non HMO) | Attending: Internal Medicine | Admitting: Internal Medicine

## 2023-04-15 DIAGNOSIS — J309 Allergic rhinitis, unspecified: Secondary | ICD-10-CM | POA: Diagnosis not present

## 2023-04-15 DIAGNOSIS — J329 Chronic sinusitis, unspecified: Secondary | ICD-10-CM | POA: Diagnosis not present

## 2023-04-15 MED ORDER — FLUTICASONE PROPIONATE 50 MCG/ACT NA SUSP: 2.0000 | Freq: Every day | NASAL | 0 refills | Status: DC

## 2023-04-15 MED ORDER — AMOXICILLIN 875 MG PO TABS: 875.0000 mg | ORAL_TABLET | Freq: Two times a day (BID) | ORAL | 0 refills | Status: DC

## 2023-04-15 MED ORDER — PSEUDOEPHEDRINE HCL 60 MG PO TABS: 60.0000 mg | ORAL_TABLET | Freq: Three times a day (TID) | ORAL | 0 refills | Status: DC | PRN

## 2023-04-15 NOTE — ED Triage Notes (Signed)
Pt c/o "sinus issues" since 8/8-states she is having abd pain, nausea x 1 week-feels abd issue r/t to nasal drainage-NAD-steady gait

## 2023-04-15 NOTE — ED Provider Notes (Signed)
[facility]  Note:  This document was prepared using Conservation officer, historic buildings and may include unintentional dictation errors.  MRN: [id number] DOB: 12-10-1975  Subjective:   Elaine Williams is a 47 y.o. female presenting for 2-week history of persistent or recurrent sinus congestion, sinus pressure, dizziness and fogginess, now having difficulty with nausea and upset stomach anytime she eats.  Has felt a significant amount of postnasal drainage in her throat.  She lives in an apartment that unfortunately has a lot of leaks and previously the apartment managers have painted over mold.  Denies chest pain, shortness of breath, diarrhea, hematemesis, bloody stools.  No history of GI disorders.  Has been using Zyrtec.  No current facility-administered medications for this encounter.  Current Outpatient Medications:    amoxicillin (AMOXIL) 875 MG tablet, Take 1 tablet (875 mg total) by mouth 2 (two) times daily., Disp: 14 tablet, Rfl: 0   cetirizine (ZYRTEC ALLERGY) 10 MG tablet, Take 1 tablet (10 mg total) by mouth daily., Disp: 30 tablet, Rfl: 0   ibuprofen (ADVIL,MOTRIN) 200 MG tablet, Take 200 mg by mouth every 6 (six) hours as needed for fever or mild pain., Disp: , Rfl:    ondansetron (ZOFRAN ODT) 8 MG disintegrating tablet, Take 1 tablet (8 mg total) by mouth every 8 (eight) hours as needed for nausea or vomiting., Disp: 20 tablet, Rfl: 0   pseudoephedrine (SUDAFED) 60 MG tablet, Take 1 tablet (60 mg total) by mouth every 8 (eight) hours as needed for congestion., Disp: 30 tablet, Rfl: 0   No Known Allergies  History reviewed. No pertinent past medical history.   Past Surgical History:  Procedure Laterality Date   CESAREAN SECTION     No family history on file.  Social History   Tobacco Use   Smoking status: Never   Smokeless tobacco: Never  Vaping Use   Vaping status: Never Used  Substance Use Topics   Alcohol use: Never   Drug use: Never     ROS   Objective:   Vitals: BP 125/87 (BP Location: Right Arm)   Pulse 86   Temp 99.3 F (37.4 C) (Oral)   Resp 20   LMP 04/01/2023   SpO2 97%   Physical Exam Constitutional:      General: She is not in acute distress.    Appearance: Normal appearance. She is well-developed and normal weight. She is not ill-appearing, toxic-appearing or diaphoretic.  HENT:     Head: Normocephalic and atraumatic.     Right Ear: Tympanic membrane, ear canal and external ear normal. No drainage or tenderness. No middle ear effusion. There is no impacted cerumen. Tympanic membrane is not erythematous or bulging.     Left Ear: Tympanic membrane, ear canal and external ear normal. No drainage or tenderness.  No middle ear effusion. There is no impacted cerumen. Tympanic membrane is not erythematous or bulging.     Nose: Congestion present. No rhinorrhea.     Comments: Left-sided maxillary sinus tenderness.    Mouth/Throat:     Mouth: Mucous membranes are moist. No oral lesions.     Pharynx: No pharyngeal swelling, oropharyngeal exudate, posterior oropharyngeal erythema or uvula swelling.     Tonsils: No tonsillar exudate or tonsillar abscesses.  Eyes:     General: No scleral icterus.       Right eye: No discharge.        Left eye: No discharge.     Extraocular Movements: Extraocular movements intact.  Right eye: Normal extraocular motion.     Left eye: Normal extraocular motion.     Conjunctiva/sclera: Conjunctivae normal.  Cardiovascular:     Rate and Rhythm: Normal rate and regular rhythm.     Heart sounds: Normal heart sounds. No murmur heard.    No friction rub. No gallop.  Pulmonary:     Effort: Pulmonary effort is normal. No respiratory distress.     Breath sounds: No stridor. No wheezing, rhonchi or rales.  Chest:     Chest wall: No tenderness.  Musculoskeletal:     Cervical back: Normal range of motion and neck supple.  Lymphadenopathy:     Cervical: No cervical adenopathy.   Skin:    General: Skin is warm and dry.  Neurological:     General: No focal deficit present.     Mental Status: She is alert and oriented to person, place, and time.  Psychiatric:        Mood and Affect: Mood normal.        Behavior: Behavior normal.     Assessment and Plan :   PDMP not reviewed this encounter.  1. Recurrent sinusitis   2. Allergic rhinitis, unspecified seasonality, unspecified trigger     Will start treatment for recurrent sinusitis with amoxicillin.  Suspect this is secondary to mold exposure in her home.  Recommended maintaining Zyrtec daily and long-term.  When she clears her infection, can use Flonase.  Use pseudoephedrine as needed.  Deferred imaging given clear cardiopulmonary exam, hemodynamically stable vital signs.  Counseled patient on potential for adverse effects with medications prescribed/recommended today, ER and return-to-clinic precautions discussed, patient verbalized understanding.    Wallis Bamberg, [place] 04/15/23 [phone]

## 2023-04-15 NOTE — Discharge Instructions (Signed)
I am treating you for recurrent sinus infection with amoxicillin.  Please continue taking Zyrtec once daily and long-term every day.  This will help to keep your sinuses and check.  This week you can use pseudoephedrine and then going forward only as you need to.  After you get completely better from her sinus infection, you can start taking Flonase daily using 2 sprays to each nostril.  This can be used together with Zyrtec to also help keep your sinuses controlled.  Make sure you follow-up with your apartment manager to evaluate for mold in your home as you have had difficulty with this issue.

## 2023-07-08 ENCOUNTER — Ambulatory Visit
Admission: EM | Admit: 2023-07-08 | Discharge: 2023-07-08 | Disposition: A | Discharge status: Home / Self Care | Payer: Managed Care, Other (non HMO) | Attending: Internal Medicine | Admitting: Internal Medicine

## 2023-07-08 DIAGNOSIS — R350 Frequency of micturition: Secondary | ICD-10-CM | POA: Diagnosis not present

## 2023-07-08 DIAGNOSIS — N10 Acute tubulo-interstitial nephritis: Secondary | ICD-10-CM

## 2023-07-08 LAB — POCT URINALYSIS DIP (MANUAL ENTRY)
Bilirubin, UA: NEGATIVE
Glucose, UA: NEGATIVE mg/dL
Ketones, POC UA: NEGATIVE mg/dL
Nitrite, UA: NEGATIVE
Protein Ur, POC: >=300 mg/dL — AB
Spec Grav, UA: 1.02 (ref 1.010–1.025)
Urobilinogen, UA: 0.2 U/dL
pH, UA: 6 (ref 5.0–8.0)

## 2023-07-08 MED ORDER — CIPROFLOXACIN HCL 500 MG PO TABS: 500.0000 mg | ORAL_TABLET | Freq: Two times a day (BID) | ORAL | 0 refills | Status: DC

## 2023-07-08 MED ORDER — CEFTRIAXONE SODIUM 1 G IJ SOLR
1.0000 g | Freq: Once | INTRAMUSCULAR | Status: AC
  Administered 2023-07-08: 1 g via INTRAMUSCULAR

## 2023-07-08 NOTE — ED Triage Notes (Signed)
Pt c/o left lower back/flank pain-started when she woke this am-denies injury-taking ibuprofen/last dose 10am-NAD-steady gait

## 2023-07-08 NOTE — ED Provider Notes (Signed)
[facility]  Note:  This document was prepared using Conservation officer, historic buildings and may include unintentional dictation errors.  MRN: [id number] DOB: [birth year]  Subjective:   Elaine Williams is a 47 y.o. female presenting for 1 day history of acute onset left-sided pain, left-sided flank pain, urinary frequency.  No fall, trauma, numbness or tingling, saddle paresthesia, changes to bowel or urinary habits, radicular symptoms.  Patient has been trying to hydrate.  She did take ibuprofen with minimal relief.  No current facility-administered medications for this encounter.  Current Outpatient Medications:    amoxicillin (AMOXIL) 875 MG tablet, Take 1 tablet (875 mg total) by mouth 2 (two) times daily., Disp: 14 tablet, Rfl: 0   cetirizine (ZYRTEC ALLERGY) 10 MG tablet, Take 1 tablet (10 mg total) by mouth daily., Disp: 30 tablet, Rfl: 0   fluticasone (FLONASE) 50 MCG/ACT nasal spray, Place 2 sprays into both nostrils daily., Disp: 16 g, Rfl: 0   ibuprofen (ADVIL,MOTRIN) 200 MG tablet, Take 200 mg by mouth every 6 (six) hours as needed for fever or mild pain., Disp: , Rfl:    ondansetron (ZOFRAN ODT) 8 MG disintegrating tablet, Take 1 tablet (8 mg total) by mouth every 8 (eight) hours as needed for nausea or vomiting., Disp: 20 tablet, Rfl: 0   pseudoephedrine (SUDAFED) 60 MG tablet, Take 1 tablet (60 mg total) by mouth every 8 (eight) hours as needed for congestion., Disp: 30 tablet, Rfl: 0   No Known Allergies  History reviewed. No pertinent past medical history.   Past Surgical History:  Procedure Laterality Date   CESAREAN SECTION      No family history on file.  Social History   Tobacco Use   Smoking status: Never   Smokeless tobacco: Never  Vaping Use   Vaping status: Never Used  Substance Use Topics   Alcohol use: Never   Drug use: Never    ROS   Objective:   Vitals: BP 138/83 (BP Location: Left Arm)   Pulse 79   Temp 98.6 F (37  C) (Oral)   Resp 20   LMP 07/08/2023   SpO2 96%   Physical Exam Constitutional:      General: She is not in acute distress.    Appearance: Normal appearance. She is well-developed. She is not ill-appearing, toxic-appearing or diaphoretic.  HENT:     Head: Normocephalic and atraumatic.     Nose: Nose normal.     Mouth/Throat:     Mouth: Mucous membranes are moist.  Eyes:     General: No scleral icterus.       Right eye: No discharge.        Left eye: No discharge.     Extraocular Movements: Extraocular movements intact.     Conjunctiva/sclera: Conjunctivae normal.  Cardiovascular:     Rate and Rhythm: Normal rate.  Pulmonary:     Effort: Pulmonary effort is normal.  Abdominal:     General: Bowel sounds are normal. There is no distension.     Palpations: Abdomen is soft. There is no mass.     Tenderness: There is no abdominal tenderness. There is left CVA tenderness. There is no right CVA tenderness, guarding or rebound.  Skin:    General: Skin is warm and dry.  Neurological:     General: No focal deficit present.     Mental Status: She is alert and oriented to person, place, and time.  Psychiatric:  Mood and Affect: Mood normal.        Behavior: Behavior normal.        Thought Content: Thought content normal.        Judgment: Judgment normal.    Results for orders placed or performed during the hospital encounter of 07/08/23 (from the past 24 hour(s))  POCT urinalysis dipstick     Status: Abnormal   Collection Time: 07/08/23  2:23 PM  Result Value Ref Range   Color, UA red (A) yellow   Clarity, UA cloudy (A) clear   Glucose, UA negative negative mg/dL   Bilirubin, UA negative negative   Ketones, POC UA negative negative mg/dL   Spec Grav, UA 1.610 9.604 - 1.025   Blood, UA large (A) negative   pH, UA 6.0 5.0 - 8.0   Protein Ur, POC >=300 (A) negative mg/dL   Urobilinogen, UA 0.2 0.2 or 1.0 E.U./dL   Nitrite, UA Negative Negative   Leukocytes, UA Small (1+)  (A) Negative   IM ceftriaxone 1000 mg administered in clinic.  Assessment and Plan :   PDMP not reviewed this encounter.  1. Acute pyelonephritis   2. Urinary frequency    Will manage for acute pyelonephritis with IM ceftriaxone, outpatient p.o. ciprofloxacin.  Urine culture pending.  Push fluids.  Counseled patient on potential for adverse effects with medications prescribed/recommended today, ER and return-to-clinic precautions discussed, patient verbalized understanding.    Wallis Bamberg, PA-C 07/08/23 1601

## 2023-07-08 NOTE — Discharge Instructions (Signed)
Please start ciprofloxacin to address a kidney infection. You've already received an injection of an antibiotic in our clinic to help start your treatment. Make sure you hydrate very well with plain water and a quantity of 80 ounces of water a day.  Please limit drinks that are considered urinary irritants such as soda, sweet tea, coffee, energy drinks, alcohol.  These can worsen your urinary and genital symptoms but also be the source of them.  I will let you know about your urine culture results through MyChart to see if we need to prescribe or change your antibiotics based off of those results.

## 2023-07-11 LAB — URINE CULTURE: Culture: >=100000 — AB

## 2024-03-22 ENCOUNTER — Ambulatory Visit
Admission: EM | Admit: 2024-03-22 | Discharge: 2024-03-22 | Disposition: A | Discharge status: Home / Self Care | Attending: Family Medicine | Admitting: Family Medicine

## 2024-03-22 DIAGNOSIS — K047 Periapical abscess without sinus: Secondary | ICD-10-CM | POA: Diagnosis not present

## 2024-03-22 DIAGNOSIS — K529 Noninfective gastroenteritis and colitis, unspecified: Secondary | ICD-10-CM | POA: Diagnosis not present

## 2024-03-22 MED ORDER — ONDANSETRON 8 MG PO TBDP: 8.0000 mg | ORAL_TABLET | Freq: Three times a day (TID) | ORAL | 0 refills | Status: AC | PRN

## 2024-03-22 MED ORDER — AMOXICILLIN-POT CLAVULANATE 875-125 MG PO TABS: 1.0000 | ORAL_TABLET | Freq: Two times a day (BID) | ORAL | 0 refills | Status: DC

## 2024-03-22 MED ORDER — CELECOXIB 200 MG PO CAPS: 200.0000 mg | ORAL_CAPSULE | Freq: Two times a day (BID) | ORAL | 0 refills | Status: AC

## 2024-03-22 NOTE — ED Triage Notes (Signed)
 Pt c/o right upper dental pain x 3 days-also c/o n/v and bloody stools started last night-taking ibuprofen for pain/last dose ~1am-NAD-steady gait

## 2024-03-22 NOTE — ED Provider Notes (Signed)
 [facility]  Note:  This document was prepared using Conservation officer, historic buildings and may include unintentional dictation errors.  MRN: [id number] DOB: 1976/07/25  Subjective:   Elaine Williams is a 48 y.o. female presenting for 3 day history of right sided upper dental pain that is severe, interrupting her eating, drinking and sleep.  Has known history of significant dental issues.  Due to financial burden has not been able to get this situated.  Yesterday, she started to have nausea, vomiting, belly pain, upset stomach, bloody stools but not diarrhea or loose stool. No fever, perianal pain, history of hemorrhoids, constipation, recent antibiotic use, hospitalizations or long distance travel.  Has not eaten raw foods, drank unfiltered water.  No history of GI disorders including Crohn's, IBS, ulcerative colitis.   No current facility-administered medications for this encounter.  Current Outpatient Medications:    ibuprofen (ADVIL,MOTRIN) 200 MG tablet, Take 200 mg by mouth every 6 (six) hours as needed for fever or mild pain., Disp: , Rfl:    No Known Allergies  History reviewed. No pertinent past medical history.   Past Surgical History:  Procedure Laterality Date   CESAREAN SECTION      No family history on file.  Social History   Tobacco Use   Smoking status: Never   Smokeless tobacco: Never  Vaping Use   Vaping status: Never Used  Substance Use Topics   Alcohol use: Never   Drug use: Never    ROS   Objective:   Vitals: BP (P) 124/77 (BP Location: Left Arm)   Pulse (P) 76   Temp (P) 98 F (36.7 C) (Oral)   Resp (P) 20   LMP 03/14/2024   SpO2 (P) 97%   Physical Exam Constitutional:      General: She is not in acute distress.    Appearance: Normal appearance. She is well-developed. She is not ill-appearing, toxic-appearing or diaphoretic.  HENT:     Head: Normocephalic and atraumatic.     Nose: Nose normal.     Mouth/Throat:      Mouth: Mucous membranes are moist.     Dentition: Abnormal dentition. Does not have dentures. Dental tenderness, dental caries and dental abscesses present. No gingival swelling or gum lesions.     Pharynx: No pharyngeal swelling, oropharyngeal exudate, posterior oropharyngeal erythema or uvula swelling.     Tonsils: No tonsillar exudate or tonsillar abscesses. 0 on the right. 0 on the left.      Comments: Multiple missing molars.  The area outlined in notes a molar with a significant dental defect.  There is exquisite tenderness about this area. Eyes:     General: No scleral icterus.       Right eye: No discharge.        Left eye: No discharge.     Extraocular Movements: Extraocular movements intact.     Conjunctiva/sclera: Conjunctivae normal.  Cardiovascular:     Rate and Rhythm: Normal rate.  Pulmonary:     Effort: Pulmonary effort is normal.  Abdominal:     General: Bowel sounds are increased. There is no distension.     Palpations: Abdomen is soft. There is no mass.     Tenderness: There is no abdominal tenderness. There is no right CVA tenderness, left CVA tenderness, guarding or rebound.  Genitourinary:    Comments: Patient politely refused perianal exam. Skin:    General: Skin is warm and dry.  Neurological:     General: No  focal deficit present.     Mental Status: She is alert and oriented to person, place, and time.  Psychiatric:        Mood and Affect: Mood normal.        Behavior: Behavior normal.        Thought Content: Thought content normal.        Judgment: Judgment normal.    Assessment and Plan :   PDMP not reviewed this encounter.  1. Dental infection   2. Gastroenteritis, acute    Recommended Augmentin  for suspected dental infection.  Celecoxib  due to lower risk of GI bleeding to help manage her pain.  Discussed the possibility of a concurrent infectious gastroenteritis, colitis and the possibility of the antibiotics may make this worse.  Patient  verbalized understanding, accepts the inherent risk.  GI panel pending.  If she is not able to bring the sample in today by close, I provided her with information to our partner urgent care at [facility].  She can bring this by 6 PM otherwise can return tomorrow with a stool sample.  Counseled patient on potential for adverse effects with medications prescribed/recommended today, ER and return-to-clinic precautions discussed, patient verbalized understanding.    Christopher Savannah, [place] 03/22/24 [phone]

## 2024-03-22 NOTE — Discharge Instructions (Addendum)
 Start amoxicillin -clavulanate for the dental infection. You can use celecoxib  for your severe dental pain. Take these medications with food. Follow up with your dental specialist asap. You can try one listed below. Make sure you push fluids drinking mostly water but mix it with Gatorade.  Try to eat light meals including soups, broths and soft foods, fruits.  You may use Zofran  for your nausea and vomiting once every 8 hours.  Imodium can help with diarrhea but use this carefully limiting it to 1-2 times per day only if you are having a lot of diarrhea.    [facility] in [place], [place]  Address: [address] [place], [place], [place] [address] Phone: [phone]  [facility] [phone] extension [phone] [address]  Dr. Encarnacion [phone] [address]  [facility] [phone] [address] [facility] [place].  [facility]cue mission [phone] extension [phone] [place], [place], [address] First come first serve for the first 10 clients.  May do simple extractions only, no wisdom teeth or surgery.  You may try the second for Thursday of the month starting at 6:30 AM.  [facility] You may call the school to see if they are still helping to provide dental care for emergent cases.

## 2024-07-03 ENCOUNTER — Other Ambulatory Visit: Payer: Self-pay

## 2024-07-03 ENCOUNTER — Ambulatory Visit
Admission: RE | Admit: 2024-07-03 | Discharge: 2024-07-03 | Disposition: A | Discharge status: Home / Self Care | Source: Ambulatory Visit | Attending: Internal Medicine | Admitting: Internal Medicine

## 2024-07-03 VITALS — BP 103/72 | HR 88 | Temp 98.7°F | Resp 16

## 2024-07-03 DIAGNOSIS — R42 Dizziness and giddiness: Secondary | ICD-10-CM | POA: Diagnosis not present

## 2024-07-03 DIAGNOSIS — K047 Periapical abscess without sinus: Secondary | ICD-10-CM

## 2024-07-03 DIAGNOSIS — K0889 Other specified disorders of teeth and supporting structures: Secondary | ICD-10-CM | POA: Diagnosis not present

## 2024-07-03 MED ORDER — DEXAMETHASONE SOD PHOSPHATE PF 10 MG/ML IJ SOLN
10.0000 mg | Freq: Once | INTRAMUSCULAR | Status: AC
  Administered 2024-07-03: 10 mg via INTRAMUSCULAR

## 2024-07-03 MED ORDER — AMOXICILLIN-POT CLAVULANATE 875-125 MG PO TABS: 1.0000 | ORAL_TABLET | Freq: Two times a day (BID) | ORAL | 0 refills | Status: AC

## 2024-07-03 MED ORDER — IBUPROFEN 800 MG PO TABS: 800.0000 mg | ORAL_TABLET | Freq: Three times a day (TID) | ORAL | 0 refills | Status: DC

## 2024-07-03 NOTE — ED Provider Notes (Signed)
 [facility]    CSN: [id number] Arrival date & time: 07/03/24  1234      History   Chief Complaint Chief Complaint  Patient presents with   Dizziness    Issue with toothache. Fever night before - Entered by patient    HPI Elaine Williams is a 48 y.o. female.   48 year old female presents urgent care with complaints of dizziness, fever, feeling cold and possible dental infection.  She reports her symptoms started about 2 days ago.  She has generally not felt well for the last 2 days.  She denies any upper respiratory symptoms such as cough, congestion, shortness of breath, sore throat.  She has noted a fractured tooth with increasing dental pain on the upper right molar.  She reports that the dizziness has turned more into a lightheaded type feeling.  She denies any chest pain, difficulty speaking, slurred speech, facial paralysis, numbness, tingling, loss of consciousness.   Dizziness Associated symptoms: no chest pain, no palpitations, no shortness of breath and no vomiting     History reviewed. No pertinent past medical history.  There are no active problems to display for this patient.   Past Surgical History:  Procedure Laterality Date   CESAREAN SECTION      OB History   No obstetric history on file.      Home Medications    Prior to Admission medications   Medication Sig Start Date End Date Taking? Authorizing Provider  amoxicillin -clavulanate (AUGMENTIN ) 875-125 MG tablet Take 1 tablet by mouth every 12 (twelve) hours for 10 days. 07/03/24 07/13/24 Yes Allan Minotti A, PA-C  ibuprofen (ADVIL) 800 MG tablet Take 1 tablet (800 mg total) by mouth 3 (three) times daily. 07/03/24  Yes Revin Corker A, PA-C  celecoxib  (CELEBREX ) 200 MG capsule Take 1 capsule (200 mg total) by mouth 2 (two) times daily. 03/22/24   Christopher Savannah, PA-C  ondansetron  (ZOFRAN -ODT) 8 MG disintegrating tablet Take 1 tablet (8 mg total) by mouth every 8 (eight) hours as needed for  nausea or vomiting. 03/22/24   Christopher Savannah, PA-C    Family History History reviewed. No pertinent family history.  Social History Social History   Tobacco Use   Smoking status: Never   Smokeless tobacco: Never  Vaping Use   Vaping status: Never Used  Substance Use Topics   Alcohol use: Never   Drug use: Never     Allergies   Patient has no known allergies.   Review of Systems Review of Systems  Constitutional:  Positive for chills and fever.  HENT:  Positive for dental problem. Negative for ear pain and sore throat.   Eyes:  Negative for pain and visual disturbance.  Respiratory:  Negative for cough and shortness of breath.   Cardiovascular:  Negative for chest pain and palpitations.  Gastrointestinal:  Negative for abdominal pain and vomiting.  Genitourinary:  Negative for dysuria and hematuria.  Musculoskeletal:  Negative for arthralgias and back pain.  Skin:  Negative for color change and rash.  Neurological:  Positive for dizziness and light-headedness. Negative for seizures and syncope.  All other systems reviewed and are negative.    Physical Exam Triage Vital Signs ED Triage Vitals  Encounter Vitals Group     BP 07/03/24 1244 103/72     Girls Systolic BP Percentile --      Girls Diastolic BP Percentile --      Boys Systolic BP Percentile --      Boys Diastolic BP Percentile --  Pulse Rate 07/03/24 1244 88     Resp 07/03/24 1244 16     Temp 07/03/24 1244 98.7 F (37.1 C)     Temp Source 07/03/24 1244 Oral     SpO2 07/03/24 1244 94 %     Weight --      Height --      Head Circumference --      Peak Flow --      Pain Score 07/03/24 1243 7     Pain Loc --      Pain Education --      Exclude from Growth Chart --    No data found.  Updated Vital Signs BP 103/72   Pulse 88   Temp 98.7 F (37.1 C) (Oral)   Resp 16   LMP 06/08/2024   SpO2 94%   Visual Acuity Right Eye Distance:   Left Eye Distance:   Bilateral Distance:    Right Eye  Near:   Left Eye Near:    Bilateral Near:     Physical Exam Vitals and nursing note reviewed.  Constitutional:      General: She is not in acute distress.    Appearance: She is well-developed.  HENT:     Head: Normocephalic and atraumatic.     Right Ear: Tympanic membrane normal.     Left Ear: Tympanic membrane normal.     Nose: Nose normal.     Mouth/Throat:     Mouth: Mucous membranes are moist.   Eyes:     Conjunctiva/sclera: Conjunctivae normal.  Cardiovascular:     Rate and Rhythm: Normal rate and regular rhythm.     Heart sounds: No murmur heard. Pulmonary:     Effort: Pulmonary effort is normal. No respiratory distress.     Breath sounds: Normal breath sounds.  Abdominal:     Palpations: Abdomen is soft.     Tenderness: There is no abdominal tenderness.  Musculoskeletal:        General: No swelling.     Cervical back: Neck supple.  Skin:    General: Skin is warm and dry.     Capillary Refill: Capillary refill takes less than 2 seconds.  Neurological:     General: No focal deficit present.     Mental Status: She is alert and oriented to person, place, and time.     Cranial Nerves: No cranial nerve deficit.     Coordination: Coordination normal.     Gait: Gait normal.  Psychiatric:        Mood and Affect: Mood normal.        Behavior: Behavior normal.        Thought Content: Thought content normal.        Judgment: Judgment normal.      UC Treatments / Results  Labs (all labs ordered are listed, but only abnormal results are displayed) Labs Reviewed  COMPREHENSIVE METABOLIC PANEL WITH GFR  CBC    EKG   Radiology No results found.  Procedures Procedures (including critical care time)  Medications Ordered in UC Medications  dexamethasone (DECADRON) injection 10 mg (10 mg Intramuscular Given 07/03/24 1334)    Initial Impression / Assessment and Plan / UC Course  I have reviewed the triage vital signs and the nursing notes.  Pertinent labs &  imaging results that were available during my care of the patient were reviewed by me and considered in my medical decision making (see chart for details).     Dizziness -  Plan: Comprehensive metabolic panel, CBC, Comprehensive metabolic panel, CBC  Dental infection  Pain, dental   Symptoms and physical exam findings are most consistent with a dental infection that is likely leading to the fever and dizzy symptoms.  Overall the physical exam and vital signs are reassuring.  We will start antibiotics for the dental infection and also do a dose of Decadron in the office today for pain and inflammation.  Due to the dizziness symptoms we will order blood work today including a complete blood count and complete metabolic panel to ensure that there are no significant abnormalities that may be contributing to the dizziness symptoms.  If the dizziness symptoms worsen or you develop concerning symptoms such as difficulty speaking, facial paralysis, fever that is not responding to Tylenol  or ibuprofen then would recommend going to the emergency room immediately for further evaluation.  The right upper molar will need further evaluation by a dentist and recommend scheduling appointment for further evaluation with them.  We will treat with the following today: Decadron injection given today. This is a steroid to help with inflammation and pain.  Augmentin  875 mg twice daily for 10 days.  This is an antibiotic.  Take this with food.  Ibuprofen 800 mg every 8 hours as needed for pain.  May alternate this with Tylenol  if needed Make sure to stay hydrated by drinking plenty of water.  Contact a dentist to schedule a follow-up appointment in the next 7 to 10 days We have drawn blood today for a complete blood count and complete metabolic panel.  These results will take approximate 24 to 48 hours to finalize.  If there are any abnormalities that need further intervention we will contact you.  Otherwise the results  will be available on your MyChart. If you feel symptoms are worsening especially the dizziness symptoms then recommend going to the emergency room immediately for further evaluation May return to urgent care if needed  Final Clinical Impressions(s) / UC Diagnoses   Final diagnoses:  Dizziness  Dental infection  Pain, dental     Discharge Instructions      Symptoms and physical exam findings are most consistent with a dental infection that is likely leading to the fever and dizzy symptoms.  Overall the physical exam and vital signs are reassuring.  We will start antibiotics for the dental infection and also do a dose of Decadron in the office today for pain and inflammation.  Due to the dizziness symptoms we will order blood work today including a complete blood count and complete metabolic panel to ensure that there are no significant abnormalities that may be contributing to the dizziness symptoms.  If the dizziness symptoms worsen or you develop concerning symptoms such as difficulty speaking, facial paralysis, fever that is not responding to Tylenol  or ibuprofen then would recommend going to the emergency room immediately for further evaluation.  The right upper molar will need further evaluation by a dentist and recommend scheduling appointment for further evaluation with them.  We will treat with the following today: Decadron injection given today. This is a steroid to help with inflammation and pain.  Augmentin  875 mg twice daily for 10 days.  This is an antibiotic.  Take this with food.  Ibuprofen 800 mg every 8 hours as needed for pain.  May alternate this with Tylenol  if needed Make sure to stay hydrated by drinking plenty of water.  Contact a dentist to schedule a follow-up appointment in the next 7 to  10 days We have drawn blood today for a complete blood count and complete metabolic panel.  These results will take approximate 24 to 48 hours to finalize.  If there are any  abnormalities that need further intervention we will contact you.  Otherwise the results will be available on your MyChart. If you feel symptoms are worsening especially the dizziness symptoms then recommend going to the emergency room immediately for further evaluation May return to urgent care if needed     ED Prescriptions     Medication Sig Dispense Auth. Provider   amoxicillin -clavulanate (AUGMENTIN ) 875-125 MG tablet Take 1 tablet by mouth every 12 (twelve) hours for 10 days. 20 tablet Talesha Ellithorpe A, PA-C   ibuprofen (ADVIL) 800 MG tablet Take 1 tablet (800 mg total) by mouth 3 (three) times daily. 21 tablet Teresa Almarie LABOR, PA-C      PDMP not reviewed this encounter.   Teresa Almarie LABOR, PA-C 07/03/24 1334

## 2024-07-03 NOTE — ED Triage Notes (Signed)
 Pt c/o felt cold, dizziness, blurred vision and felt like she was going to pass out, fever 100.1 yesterday at work. Pt states 2 days ago she felt generalized weakness and fatigue. Pt states dizziness started around 1000 yesterday, but not exactly sure what time. Pt states today feels more lightheaded today than dizzy. PT states has a rotting tooth in her mouth and thinks that may be why she's having these sx.

## 2024-07-03 NOTE — Discharge Instructions (Addendum)
 Symptoms and physical exam findings are most consistent with a dental infection that is likely leading to the fever and dizzy symptoms.  Overall the physical exam and vital signs are reassuring.  We will start antibiotics for the dental infection and also do a dose of Decadron in the office today for pain and inflammation.  Due to the dizziness symptoms we will order blood work today including a complete blood count and complete metabolic panel to ensure that there are no significant abnormalities that may be contributing to the dizziness symptoms.  If the dizziness symptoms worsen or you develop concerning symptoms such as difficulty speaking, facial paralysis, fever that is not responding to Tylenol  or ibuprofen then would recommend going to the emergency room immediately for further evaluation.  The right upper molar will need further evaluation by a dentist and recommend scheduling appointment for further evaluation with them.  We will treat with the following today: Decadron injection given today. This is a steroid to help with inflammation and pain.  Augmentin  875 mg twice daily for 10 days.  This is an antibiotic.  Take this with food.  Ibuprofen 800 mg every 8 hours as needed for pain.  May alternate this with Tylenol  if needed Make sure to stay hydrated by drinking plenty of water.  Contact a dentist to schedule a follow-up appointment in the next 7 to 10 days We have drawn blood today for a complete blood count and complete metabolic panel.  These results will take approximate 24 to 48 hours to finalize.  If there are any abnormalities that need further intervention we will contact you.  Otherwise the results will be available on your MyChart. If you feel symptoms are worsening especially the dizziness symptoms then recommend going to the emergency room immediately for further evaluation May return to urgent care if needed

## 2024-07-04 LAB — COMPREHENSIVE METABOLIC PANEL WITH GFR
ALT: 11 IU/L (ref 0–32)
AST: 13 IU/L (ref 0–40)
Albumin: 4.4 g/dL (ref 3.9–4.9)
Alkaline Phosphatase: 76 IU/L (ref 41–116)
BUN/Creatinine Ratio: 20 (ref 9–23)
BUN: 13 mg/dL (ref 6–24)
Bilirubin Total: 0.4 mg/dL (ref 0.0–1.2)
CO2: 21 mmol/L (ref 20–29)
Calcium: 9.7 mg/dL (ref 8.7–10.2)
Chloride: 105 mmol/L (ref 96–106)
Creatinine, Ser: 0.65 mg/dL (ref 0.57–1.00)
Globulin, Total: 2.6 g/dL (ref 1.5–4.5)
Glucose: 88 mg/dL (ref 70–99)
Potassium: 4.4 mmol/L (ref 3.5–5.2)
Sodium: 138 mmol/L (ref 134–144)
Total Protein: 7 g/dL (ref 6.0–8.5)
eGFR: 108 mL/min/1.73 (ref >59)

## 2024-07-04 LAB — CBC
Hematocrit: 43.3 % (ref 34.0–46.6)
Hemoglobin: 14.2 g/dL (ref 11.1–15.9)
MCH: 30.9 pg (ref 26.6–33.0)
MCHC: 32.8 g/dL (ref 31.5–35.7)
MCV: 94 fL (ref 79–97)
Platelets: 171 x10E3/uL (ref 150–450)
RBC: 4.6 x10E6/uL (ref 3.77–5.28)
RDW: 12.4 % (ref 11.7–15.4)
WBC: 6.9 x10E3/uL (ref 3.4–10.8)

## 2024-07-06 ENCOUNTER — Ambulatory Visit (HOSPITAL_COMMUNITY): Payer: Self-pay

## 2024-07-20 ENCOUNTER — Emergency Department (HOSPITAL_COMMUNITY)
Admission: EM | Admit: 2024-07-20 | Discharge: 2024-07-20 | Disposition: A | Discharge status: Home / Self Care | Attending: Emergency Medicine | Admitting: Emergency Medicine

## 2024-07-20 ENCOUNTER — Other Ambulatory Visit: Payer: Self-pay

## 2024-07-20 ENCOUNTER — Emergency Department (HOSPITAL_COMMUNITY)

## 2024-07-20 ENCOUNTER — Encounter (HOSPITAL_COMMUNITY): Payer: Self-pay

## 2024-07-20 DIAGNOSIS — I82890 Acute embolism and thrombosis of other specified veins: Secondary | ICD-10-CM

## 2024-07-20 DIAGNOSIS — M79662 Pain in left lower leg: Secondary | ICD-10-CM

## 2024-07-20 DIAGNOSIS — I82812 Embolism and thrombosis of superficial veins of left lower extremities: Secondary | ICD-10-CM | POA: Insufficient documentation

## 2024-07-20 DIAGNOSIS — M79605 Pain in left leg: Secondary | ICD-10-CM | POA: Diagnosis present

## 2024-07-20 LAB — CBC WITH DIFFERENTIAL/PLATELET
Abs Immature Granulocytes: 0.03 K/uL (ref 0.00–0.07)
Basophils Absolute: 0.1 K/uL (ref 0.0–0.1)
Basophils Relative: 1 %
Eosinophils Absolute: 0.1 K/uL (ref 0.0–0.5)
Eosinophils Relative: 2 %
HCT: 40.8 % (ref 36.0–46.0)
Hemoglobin: 13.4 g/dL (ref 12.0–15.0)
Immature Granulocytes: 0 %
Lymphocytes Relative: 23 %
Lymphs Abs: 1.5 K/uL (ref 0.7–4.0)
MCH: 30.7 pg (ref 26.0–34.0)
MCHC: 32.8 g/dL (ref 30.0–36.0)
MCV: 93.6 fL (ref 80.0–100.0)
Monocytes Absolute: 0.6 K/uL (ref 0.1–1.0)
Monocytes Relative: 9 %
Neutro Abs: 4.4 K/uL (ref 1.7–7.7)
Neutrophils Relative %: 65 %
Platelets: 178 K/uL (ref 150–400)
RBC: 4.36 MIL/uL (ref 3.87–5.11)
RDW: 12.5 % (ref 11.5–15.5)
WBC: 6.8 K/uL (ref 4.0–10.5)
nRBC: 0 % (ref 0.0–0.2)

## 2024-07-20 LAB — BASIC METABOLIC PANEL WITH GFR
Anion gap: 10 (ref 5–15)
BUN: 18 mg/dL (ref 6–20)
CO2: 22 mmol/L (ref 22–32)
Calcium: 9 mg/dL (ref 8.9–10.3)
Chloride: 107 mmol/L (ref 98–111)
Creatinine, Ser: 0.68 mg/dL (ref 0.44–1.00)
GFR, Estimated: >60 mL/min (ref >60)
Glucose, Bld: 92 mg/dL (ref 70–99)
Potassium: 4.1 mmol/L (ref 3.5–5.1)
Sodium: 139 mmol/L (ref 135–145)

## 2024-07-20 MED ORDER — IBUPROFEN 800 MG PO TABS: 800.0000 mg | ORAL_TABLET | Freq: Three times a day (TID) | ORAL | 0 refills | Status: AC

## 2024-07-20 NOTE — ED Provider Notes (Signed)
 [facility] Provider Note   CSN: [id number] Arrival date & time: 07/20/24  9073     Patient presents with: Leg Pain   Elaine Williams is a 48 y.o. female with a history of varicose veins, who presents to the ED with left leg pain that started 1 week ago.  Patient states that she noticed a small, inflamed lump on the medial portion of her left lower leg that she believes could be a DVT. Patient states that it has become increasingly more painful to ambulate on the left lower extremity. Patient states that the area appears red and feels warm to the touch, however has stayed localized to that area and has not spread over the last week. Patient denies trying home supportive care. Patient is in no acute distress.    Leg Pain      Prior to Admission medications   Medication Sig Start Date End Date Taking? Authorizing Provider  celecoxib  (CELEBREX ) 200 MG capsule Take 1 capsule (200 mg total) by mouth 2 (two) times daily. 03/22/24   Christopher Savannah, PA-C  ibuprofen  (ADVIL ) 800 MG tablet Take 1 tablet (800 mg total) by mouth 3 (three) times daily for 14 days. 07/20/24 08/03/24  Willma Bouchard L, PA  ondansetron  (ZOFRAN -ODT) 8 MG disintegrating tablet Take 1 tablet (8 mg total) by mouth every 8 (eight) hours as needed for nausea or vomiting. 03/22/24   Christopher Savannah, PA-C    Allergies: Patient has no known allergies.    Review of Systems  Musculoskeletal:        Left leg pain    Updated Vital Signs BP 113/70   Pulse 74   Temp 97.9 F (36.6 C)   Resp 18   Ht 5' 7 (1.702 m)   LMP 06/08/2024   SpO2 100%   BMI 28.19 kg/m   Physical Exam Vitals and nursing note reviewed.  Constitutional:      General: She is not in acute distress.    Appearance: Normal appearance.  HENT:     Head: Normocephalic and atraumatic.  Eyes:     Extraocular Movements: Extraocular movements intact.     Conjunctiva/sclera: Conjunctivae normal.     Pupils: Pupils are  equal, round, and reactive to light.  Cardiovascular:     Rate and Rhythm: Normal rate and regular rhythm.     Pulses: Normal pulses.          Popliteal pulses are 2+ on the right side and 2+ on the left side.       Dorsalis pedis pulses are 2+ on the right side and 2+ on the left side.  Pulmonary:     Effort: Pulmonary effort is normal. No respiratory distress.     Breath sounds: Normal breath sounds.  Musculoskeletal:        General: Normal range of motion.     Cervical back: Normal range of motion.     Right lower leg: No edema.     Left lower leg: No edema.  Skin:    General: Skin is warm and dry.     Capillary Refill: Capillary refill takes less than 2 seconds.     Comments: Small area that appears to be a superficial vein thrombosis. Mildly erythematous, warm to the touch. Image below.  Neurological:     General: No focal deficit present.     Mental Status: She is alert. Mental status is at baseline.  Psychiatric:  Mood and Affect: Mood normal.      (all labs ordered are listed, but only abnormal results are displayed) Labs Reviewed  CBC WITH DIFFERENTIAL/PLATELET  BASIC METABOLIC PANEL WITH GFR    EKG: None  Radiology: No results found.   Procedures   Medications Ordered in the ED - No data to display                                Medical Decision Making  Patient presents to the ED for: localized, inflamed area to left lower extremity This involves an extensive number of treatment options and is a complaint that carries with it a high risk of complications  Differential diagnosis includes:  Superficial vein thrombosis  Cellulitis, Lymphangitis  Abscess DVT Co-morbid conditions: varicose veins   Clinical Course as of 07/25/24 0705  Mon Jul 20, 2024  1057 Temp: 97.7 F (36.5 C) Patient afebrile, vital stable, patient in no acute distress. [ML]  1138 On exam, mostly likely superficial vein thrombosis.  [ML]  1408 Basic metabolic panel WNL  [ML]  1408 CBC with Differential WNL [ML]  1408 VAS US  LOWER EXTREMITY VENOUS (DVT) (ONLY [facility] & [facility]) acute superficial vein thrombosis involving the left varicosities or other superficial veins of medial, proximal calf. [ML]    Clinical Course User Index [ML] Willma Duwaine CROME, [place]    Data Reviewed / Actions Taken: Labs ordered/reviewed with my independent interpretation in ED course above. Imaging ordered/reviewed with my independent interpretation in ED course above. I agree with the radiologists interpretation.  Key findings for the patient were reviewed with the attending physician   ED Course / Reassessments: Problem List: superficial vein thrombosis  48 year old female presented with concern of possible DVT. Initial assessment included history, physical exam, and review of prior medical records. Patients physical exam included revealed a small, localized erythematous/inflamed area of left lower extremity. Laboratory studies and imaging imaging were obtained and found superficial vein thrombosis.  Vital signs were obtained and monitored, and the patient remained stable throughout the stay.  Given physical exam findings, unremarkable laboratory studies, and ultrasound imaging, plan to discharge patient with supportive care measures and NSAIDs with strict follow-up precautions and close follow-up with PCP for further evaluation and care of superficial vein thrombosis.   Serial reassessments performed: Yes    Disposition: Disposition: Discharge with close follow-up with PCP for further evaluation care Rationale for disposition: Stable for discharge The disposition plan and rationale were discussed with the patient at the bedside, all questions were addressed, and the patient demonstrated understanding.  This note was produced using Electronics Engineer. While I have reviewed and verified all clinical information, transcription errors may remain.      Final diagnoses:  Superficial  vein thrombosis    ED Discharge Orders          Ordered    ibuprofen  (ADVIL ) 800 MG tablet  3 times daily        07/20/24 1513               Willma Duwaine CROME, [place] 07/25/24 9294    Dreama Longs, MD 07/25/24 [phone]

## 2024-07-20 NOTE — ED Triage Notes (Signed)
 C/O left calf pain that started a week ago. Denies SHOB, recent traveling, CP. Axox4. Ambulatory.

## 2024-07-20 NOTE — Discharge Instructions (Addendum)
 Thank you for visiting the Emergency Department today. It was a pleasure to be part of your healthcare team.  You were seen today for superficial vein thrombosis.  As discussed, it is important to utilize compression, heat, NSAIDs.  If you notice any new or worsening symptoms such as increased redness or swelling to the area, or if the swelling does not subside within 5 to 7 days please be seen by your PCP or return to the emergency department.  Thank you for trusting us  with your health.

## 2024-07-20 NOTE — Progress Notes (Signed)
 LLE venous duplex has been completed.  Preliminary results given to Dr. Dreama.   Results can be found under chart review under CV PROC. 07/20/2024 1:57 PM Neel Buffone RVT, RDMS

## 2024-07-20 NOTE — ED Notes (Signed)
 DVT US  complete, pending results

## 2024-07-20 NOTE — ED Notes (Signed)
 Alert, NAD, calm, interactive. Scrolling on phone, waiting for DVT US 

## 2024-07-20 NOTE — ED Triage Notes (Signed)
 C/o left lower leg pain onset last week , states the pain went away then yest pain returned and its much worse ,c/o hard area in her calf. Pain is worse with walking

## 2024-07-20 NOTE — ED Notes (Signed)
 Patient transported to Ultrasound

## 2024-08-14 ENCOUNTER — Encounter: Payer: Self-pay | Admitting: *Deleted

## 2024-08-14 ENCOUNTER — Ambulatory Visit: Admission: EM | Admit: 2024-08-14 | Discharge: 2024-08-14 | Disposition: A | Discharge status: Home / Self Care

## 2024-08-14 DIAGNOSIS — S0083XA Contusion of other part of head, initial encounter: Secondary | ICD-10-CM

## 2024-08-14 DIAGNOSIS — W19XXXA Unspecified fall, initial encounter: Secondary | ICD-10-CM

## 2024-08-14 DIAGNOSIS — S0081XA Abrasion of other part of head, initial encounter: Secondary | ICD-10-CM

## 2024-08-14 DIAGNOSIS — Z23 Encounter for immunization: Secondary | ICD-10-CM

## 2024-08-14 DIAGNOSIS — S8000XA Contusion of unspecified knee, initial encounter: Secondary | ICD-10-CM

## 2024-08-14 HISTORY — DX: Acute embolism and thrombosis of other specified veins: I82.890

## 2024-08-14 MED ORDER — IBUPROFEN 800 MG PO TABS
800.0000 mg | ORAL_TABLET | Freq: Once | ORAL | Status: AC
  Administered 2024-08-14: 800 mg via ORAL

## 2024-08-14 MED ORDER — TETANUS-DIPHTH-ACELL PERTUSSIS 5-2-15.5 LF-MCG/0.5 IM SUSP
0.5000 mL | Freq: Once | INTRAMUSCULAR | Status: AC
  Administered 2024-08-14: 0.5 mL via INTRAMUSCULAR

## 2024-08-14 MED ORDER — BACITRACIN ZINC 500 UNIT/GM EX OINT: TOPICAL_OINTMENT | Freq: Once | CUTANEOUS | Status: AC

## 2024-08-14 NOTE — Discharge Instructions (Addendum)
" °  1. Fall, initial encounter (Primary) 2. Contusion of knee, unspecified laterality, initial encounter 3. Facial contusion, initial encounter 4. Facial abrasion, initial encounter - ibuprofen  (ADVIL ) tablet 800 mg given in UC for acute pain secondary to fall with multiple contusions and abrasions - Wound care (Clean Wound) performed in UC prior to provider evaluation - Tdap (ADACEL ) injection 0.5 mL given in UC for infection prevention - bacitracin  ointment applied to the wound for infection prevention and hemostasis - Apply dressing to wound on right forehead for bleeding control and protection until injury heals. - Continue to monitor for any secondary signs of concussion such as headache, vision changes, nausea/vomiting, altered mental status, or discoordination.  -Continue to monitor symptoms for any change in severity if there is any escalation of current symptoms or development of new symptoms follow-up in ER for further evaluation and management. "

## 2024-08-14 NOTE — ED Provider Notes (Signed)
 " [facility]  Note:  This document was prepared using Dragon voice recognition software and may include unintentional dictation errors.  MRN: [id number] DOB: 1976-03-06  Subjective:   Elaine Williams is a 48 y.o. female presenting for evaluation following a fall that occurred this morning at work.  Patient reports that she tripped going into work and fell hitting her right face and bilateral knees on the ground.  Patient reports bilateral knee pain and bruising and right facial swelling, bruising, mild abrasion.  Patient is able to ambulate with some pain to bilateral knees and slight limp.  Patient denies any concussion-like symptoms.  No vision changes, no loss of consciousness or lethargy.  Patient denies taking any over-the-counter pain reliever prior to arrival in urgent care.  Patient reports that last known Tdap is unknown and may need Tdap vaccine.  Current Medications[1]   Allergies[2]  Past Medical History:  Diagnosis Date   Superficial vein thrombosis      Past Surgical History:  Procedure Laterality Date   CESAREAN SECTION      No family history on file.  Social History[3]  ROS Refer to HPI for ROS details.  Objective:    Vitals: BP 111/72   Pulse 86   Temp 98 F (36.7 C) (Oral)   Resp 18   LMP 08/09/2024 (Approximate)   SpO2 95%   Physical Exam Vitals and nursing note reviewed.  Constitutional:      General: She is not in acute distress.    Appearance: Normal appearance. She is well-developed. She is not ill-appearing or toxic-appearing.  HENT:     Head: Normocephalic. Abrasion and contusion present. No right periorbital erythema or left periorbital erythema.      Mouth/Throat:     Mouth: Mucous membranes are moist.  Cardiovascular:     Rate and Rhythm: Normal rate.  Pulmonary:     Effort: Pulmonary effort is normal. No respiratory distress.     Breath sounds: No stridor. No wheezing.  Musculoskeletal:     Right knee: Swelling,  ecchymosis and bony tenderness present. No deformity or crepitus. Normal range of motion. Tenderness present. Normal pulse.     Left knee: Swelling, ecchymosis and bony tenderness present. No deformity or crepitus. Normal range of motion. Tenderness present. Normal pulse.       Legs:  Skin:    General: Skin is warm and dry.  Neurological:     General: No focal deficit present.     Mental Status: She is alert and oriented to person, place, and time.  Psychiatric:        Mood and Affect: Mood normal.        Behavior: Behavior normal.     Procedures  No results found for this or any previous visit (from the past 24 hours).  Assessment and Plan :     Discharge Instructions       1. Fall, initial encounter (Primary) 2. Contusion of knee, unspecified laterality, initial encounter 3. Facial contusion, initial encounter 4. Facial abrasion, initial encounter - ibuprofen  (ADVIL ) tablet 800 mg given in UC for acute pain secondary to fall with multiple contusions and abrasions - Wound care (Clean Wound) performed in UC prior to provider evaluation - Tdap (ADACEL ) injection 0.5 mL given in UC for infection prevention - bacitracin  ointment applied to the wound for infection prevention and hemostasis - Apply dressing to wound on right forehead for bleeding control and protection until injury heals. - Continue to monitor for any secondary  signs of concussion such as headache, vision changes, nausea/vomiting, altered mental status, or discoordination.  -Continue to monitor symptoms for any change in severity if there is any escalation of current symptoms or development of new symptoms follow-up in ER for further evaluation and management.       Zaki Gertsch B Dalyce Renne    [1] No current facility-administered medications for this encounter.  Current Outpatient Medications:    celecoxib  (CELEBREX ) 200 MG capsule, Take 1 capsule (200 mg total) by mouth 2 (two) times daily., Disp: 30 capsule,  Rfl: 0   ondansetron  (ZOFRAN -ODT) 8 MG disintegrating tablet, Take 1 tablet (8 mg total) by mouth every 8 (eight) hours as needed for nausea or vomiting., Disp: 20 tablet, Rfl: 0 [2] No Known Allergies [3]  Social History Tobacco Use   Smoking status: Never   Smokeless tobacco: Never  Vaping Use   Vaping status: Never Used  Substance Use Topics   Alcohol use: Never   Drug use: Never     Aurea Goodell B, NP 08/14/24 0919  "

## 2024-08-14 NOTE — ED Triage Notes (Signed)
 Ecchymosis noted to distal left anterior knee with superficial abrasion, and small abrasion noted to right knee. Pt states left knee is more painful than right knee.

## 2024-08-14 NOTE — ED Triage Notes (Addendum)
 Pt reports tripping and falling over a gate at work approx 30 min ago; states she believes she fell onto her knees and then her face/head hit ground - denies LOC. C/O right facial pain -- abrasions noted to right side of face and small laceration above right brow without active bleeding. Denies n/v. C/O right eye watering. States starting to get HA. C/O bilat knee pain; pt ambulates with slight limp.

## 2024-08-14 NOTE — ED Notes (Signed)
 Ice packs for face and left knee provided.
